# Patient Record
Sex: Male | Born: 1975 | Race: White | Hispanic: No | Marital: Married | Smoking: Former smoker
Health system: Southern US, Community
[De-identification: ages and names within clinical notes are randomized; demographics above are authoritative.]

## PROBLEM LIST (undated history)

## (undated) DIAGNOSIS — D649 Anemia, unspecified: Secondary | ICD-10-CM

## (undated) DIAGNOSIS — E7800 Pure hypercholesterolemia, unspecified: Secondary | ICD-10-CM

## (undated) DIAGNOSIS — I25119 Atherosclerotic heart disease of native coronary artery with unspecified angina pectoris: Secondary | ICD-10-CM

## (undated) DIAGNOSIS — M4802 Spinal stenosis, cervical region: Secondary | ICD-10-CM

## (undated) DIAGNOSIS — E162 Hypoglycemia, unspecified: Secondary | ICD-10-CM

## (undated) DIAGNOSIS — I10 Essential (primary) hypertension: Secondary | ICD-10-CM

## (undated) DIAGNOSIS — Z955 Presence of coronary angioplasty implant and graft: Secondary | ICD-10-CM

## (undated) HISTORY — PX: BACK SURGERY: SHX140

## (undated) HISTORY — PX: VASECTOMY: SHX75

## (undated) HISTORY — PX: CORONARY ANGIOPLASTY WITH STENT PLACEMENT: SHX49

## (undated) HISTORY — PX: VASECTOMY REVERSAL: SHX243

## (undated) HISTORY — PX: CARDIAC CATHETERIZATION: SHX172

## (undated) HISTORY — PX: ANTERIOR CERVICAL DECOMP/DISCECTOMY FUSION: SHX1161

---

## 2008-03-04 ENCOUNTER — Encounter: Admission: RE | Admit: 2008-03-04 | Discharge: 2008-03-04 | Payer: Self-pay | Admitting: Family Medicine

## 2014-12-24 ENCOUNTER — Emergency Department (HOSPITAL_BASED_OUTPATIENT_CLINIC_OR_DEPARTMENT_OTHER)
Admission: EM | Admit: 2014-12-24 | Discharge: 2014-12-24 | Disposition: A | Discharge status: Home / Self Care | Payer: BLUE CROSS/BLUE SHIELD | Attending: Emergency Medicine | Admitting: Emergency Medicine

## 2014-12-24 ENCOUNTER — Encounter (HOSPITAL_BASED_OUTPATIENT_CLINIC_OR_DEPARTMENT_OTHER): Payer: Self-pay | Admitting: Emergency Medicine

## 2014-12-24 DIAGNOSIS — R197 Diarrhea, unspecified: Secondary | ICD-10-CM | POA: Diagnosis not present

## 2014-12-24 DIAGNOSIS — R509 Fever, unspecified: Secondary | ICD-10-CM | POA: Diagnosis not present

## 2014-12-24 DIAGNOSIS — I10 Essential (primary) hypertension: Secondary | ICD-10-CM | POA: Diagnosis not present

## 2014-12-24 DIAGNOSIS — E860 Dehydration: Secondary | ICD-10-CM | POA: Insufficient documentation

## 2014-12-24 DIAGNOSIS — R630 Anorexia: Secondary | ICD-10-CM | POA: Insufficient documentation

## 2014-12-24 DIAGNOSIS — K6289 Other specified diseases of anus and rectum: Secondary | ICD-10-CM | POA: Diagnosis not present

## 2014-12-24 DIAGNOSIS — Z79899 Other long term (current) drug therapy: Secondary | ICD-10-CM | POA: Diagnosis not present

## 2014-12-24 DIAGNOSIS — R112 Nausea with vomiting, unspecified: Secondary | ICD-10-CM

## 2014-12-24 HISTORY — DX: Essential (primary) hypertension: I10

## 2014-12-24 MED ORDER — ONDANSETRON HCL 4 MG/2ML IJ SOLN
4.0000 mg | Freq: Once | INTRAMUSCULAR | Status: AC
  Administered 2014-12-24: 4 mg via INTRAVENOUS
  Filled 2014-12-24: qty 2

## 2014-12-24 MED ORDER — DEXTROSE 5 % IV BOLUS
1000.0000 mL | Freq: Once | INTRAVENOUS | Status: AC
  Administered 2014-12-24: 1000 mL via INTRAVENOUS

## 2014-12-24 MED ORDER — GI COCKTAIL ~~LOC~~
30.0000 mL | Freq: Once | ORAL | Status: AC
  Administered 2014-12-24: 30 mL via ORAL
  Filled 2014-12-24: qty 30

## 2014-12-24 MED ORDER — SODIUM CHLORIDE 0.9 % IV BOLUS (SEPSIS)
1000.0000 mL | Freq: Once | INTRAVENOUS | Status: AC
  Administered 2014-12-24: 1000 mL via INTRAVENOUS

## 2014-12-24 NOTE — ED Notes (Signed)
Pt reports vomiting and diarrhea for last 2 days, unable to keep liquids or food down, last time vomited this am, last episode of diarrhea 2 this evening,

## 2014-12-24 NOTE — ED Provider Notes (Addendum)
CSN: [id number]     Arrival date & time 12/24/14  2056 History  This chart was scribed for Gwyneth SproutWhitney Azzam Mehra, MD by Freida Busmaniana Omoyeni, ED Scribe. This patient was seen in room MH12/MH12 and the patient's care was started 9:14 PM.    Chief Complaint  Patient presents with  . Emesis   The history is provided by the patient. No language interpreter was used.     HPI Comments:  Harry Cervantes is a 39 y.o. male who presents to the Emergency Department complaining of nausea, vomiting and diarrhea for 2 days. He last vomited this am. He reports associated cramping abdominal pain, decreased PO intake secondary to nausea, subjective fever and chills . He denies blood in his vomit or stool,. He also denies h/o stomach ulcers, recent abx use or travel outside of the country and  sick contacts. No alleviating factors noted.   Past Medical History  Diagnosis Date  . Hypertension    History reviewed. No pertinent past surgical history. History reviewed. No pertinent family history. History  Substance Use Topics  . Smoking status: Former Games developermoker  . Smokeless tobacco: Current User  . Alcohol Use: No    Review of Systems  Constitutional: Positive for fever, chills and appetite change.  Gastrointestinal: Positive for vomiting, diarrhea and rectal pain. Negative for blood in stool.  All other systems reviewed and are negative.     Allergies  Review of patient's allergies indicates no known allergies.  Home Medications   Prior to Admission medications   Medication Sig Start Date End Date Taking? Authorizing Provider  amLODipine (NORVASC) 10 MG tablet Take 10 mg by mouth daily.   Yes Historical Provider, MD  lisinopril (PRINIVIL,ZESTRIL) 10 MG tablet Take 10 mg by mouth daily.   Yes Historical Provider, MD   BP 154/99 mmHg  Pulse 91  Temp(Src) 98.6 F (37 C) (Oral)  Resp 18  Ht 6' (1.829 m)  Wt 220 lb (99.791 kg)  BMI 29.83 kg/m2  SpO2 100% Physical Exam  Constitutional: He is  oriented to person, place, and time. He appears well-developed and well-nourished. No distress.  HENT:  Head: Normocephalic and atraumatic.  Dry Mucous Membranes  Eyes: Conjunctivae and EOM are normal. Pupils are equal, round, and reactive to light.  Cardiovascular: Normal rate and intact distal pulses.   No murmur heard. Pulmonary/Chest: Effort normal and breath sounds normal. No respiratory distress. He has no wheezes. He has no rales.  Abdominal: Soft. He exhibits no distension. There is no rebound and no guarding.  Mild diffuse tenderness  Neurological: He is alert and oriented to person, place, and time.  Skin: Skin is warm and dry.  Psychiatric: He has a normal mood and affect. His behavior is normal.  Nursing note and vitals reviewed.   ED Course  Procedures   DIAGNOSTIC STUDIES:  Oxygen Saturation is 100% on RA, normal by my interpretation.    COORDINATION OF CARE:  9:18 PM Will order IV fluids and nausea meds. Discussed treatment plan with pt at bedside and pt agreed to plan.  Labs Review Labs Reviewed - No data to display  Imaging Review No results found.   EKG Interpretation None      MDM   Final diagnoses:  Nausea vomiting and diarrhea  Dehydration    Pt with symptoms most consistent with a viral process with fever/vomitting/diarrhea.  Denies bad food exposure and recent travel out of the country.  No recent abx.  No hx concerning for GU pathology  or kidney stones.  Pt is awake and alert on exam without peritoneal signs.  After IV fluids and gi cocktail and zofran pt feeling much better and d/ced home.  Tolerating po's.  I personally performed the services described in this documentation, which was scribed in my presence.  The recorded information has been reviewed and considered.    Gwyneth Sprout, MD 12/24/14 8119  Gwyneth Sprout, MD 12/24/14 2322

## 2016-10-02 ENCOUNTER — Other Ambulatory Visit: Payer: Self-pay

## 2016-10-02 ENCOUNTER — Encounter (HOSPITAL_COMMUNITY): Payer: Self-pay

## 2016-10-02 ENCOUNTER — Emergency Department (HOSPITAL_COMMUNITY)
Admission: EM | Admit: 2016-10-02 | Discharge: 2016-10-02 | Disposition: A | Discharge status: Home / Self Care | Payer: Managed Care, Other (non HMO) | Attending: Emergency Medicine | Admitting: Emergency Medicine

## 2016-10-02 ENCOUNTER — Emergency Department (HOSPITAL_COMMUNITY): Payer: Managed Care, Other (non HMO)

## 2016-10-02 DIAGNOSIS — Z87891 Personal history of nicotine dependence: Secondary | ICD-10-CM | POA: Diagnosis not present

## 2016-10-02 DIAGNOSIS — I499 Cardiac arrhythmia, unspecified: Secondary | ICD-10-CM | POA: Diagnosis not present

## 2016-10-02 DIAGNOSIS — E119 Type 2 diabetes mellitus without complications: Secondary | ICD-10-CM | POA: Insufficient documentation

## 2016-10-02 DIAGNOSIS — Z79899 Other long term (current) drug therapy: Secondary | ICD-10-CM | POA: Insufficient documentation

## 2016-10-02 DIAGNOSIS — I10 Essential (primary) hypertension: Secondary | ICD-10-CM | POA: Diagnosis not present

## 2016-10-02 DIAGNOSIS — I498 Other specified cardiac arrhythmias: Secondary | ICD-10-CM

## 2016-10-02 DIAGNOSIS — R002 Palpitations: Secondary | ICD-10-CM

## 2016-10-02 LAB — CBC
HCT: 40.9 % (ref 39.0–52.0)
Hemoglobin: 13.8 g/dL (ref 13.0–17.0)
MCH: 29.5 pg (ref 26.0–34.0)
MCHC: 33.7 g/dL (ref 30.0–36.0)
MCV: 87.4 fL (ref 78.0–100.0)
PLATELETS: 235 10*3/uL (ref 150–400)
RBC: 4.68 MIL/uL (ref 4.22–5.81)
RDW: 13.1 % (ref 11.5–15.5)
WBC: 10.7 10*3/uL — AB (ref 4.0–10.5)

## 2016-10-02 LAB — BASIC METABOLIC PANEL
Anion gap: 11 (ref 5–15)
BUN: 21 mg/dL — AB (ref 6–20)
CO2: 23 mmol/L (ref 22–32)
CREATININE: 1.02 mg/dL (ref 0.61–1.24)
Calcium: 9.1 mg/dL (ref 8.9–10.3)
Chloride: 104 mmol/L (ref 101–111)
GFR calc Af Amer: >60 mL/min (ref >60)
GLUCOSE: 119 mg/dL — AB (ref 65–99)
Potassium: 3.3 mmol/L — ABNORMAL LOW (ref 3.5–5.1)
SODIUM: 138 mmol/L (ref 135–145)

## 2016-10-02 LAB — I-STAT TROPONIN, ED: Troponin i, poc: 0 ng/mL (ref 0.00–0.08)

## 2016-10-02 NOTE — ED Notes (Signed)
Pt understood dc material. NAD noted. 

## 2016-10-02 NOTE — ED Notes (Signed)
Patient transported to X-ray 

## 2016-10-02 NOTE — Discharge Instructions (Signed)
Your EKG shows sinus arrhythmia.  This is a nonspecific finding.  It is advised that you follow-up with a cardiologist.  Please see the cardiologist before returning to any dangerous activity.

## 2016-10-02 NOTE — ED Provider Notes (Signed)
[facility]-EMERGENCY DEPT Provider Note   CSN: [id number] Arrival date & time: 10/02/16  1933     History   Chief Complaint Chief Complaint  Patient presents with  . Chest Pain    HPI Harry Cervantes is a 41 y.o. male.  Patient with past medical history of diabetes and hypertension presents emergency department with chief complaint of heart palpitations. He also reports that he had some chest pain yesterday. He denies any chest pain now. He states that when he has the palpitations, he feels short of breath. He denies any radiating pain, diaphoresis, nausea, or vomiting. He has not taken anything for his symptoms. He denies any history of PE, DVT, leg swelling, recent long travel or surgeries.   The history is provided by the patient. No language interpreter was used.    Past Medical History:  Diagnosis Date  . Diabetes mellitus without complication (HCC)   . Hypertension     There are no active problems to display for this patient.   History reviewed. No pertinent surgical history.     Home Medications    Prior to Admission medications   Medication Sig Start Date End Date Taking? Authorizing Provider  amLODipine (NORVASC) 10 MG tablet Take 10 mg by mouth daily.    Historical Provider, MD  lisinopril (PRINIVIL,ZESTRIL) 10 MG tablet Take 10 mg by mouth daily.    Historical Provider, MD    Family History No family history on file.  Social History Social History  Substance Use Topics  . Smoking status: Former Games developer  . Smokeless tobacco: Current User  . Alcohol use No     Allergies   Patient has no known allergies.   Review of Systems Review of Systems  Respiratory: Positive for shortness of breath.   Cardiovascular: Positive for palpitations.  All other systems reviewed and are negative.    Physical Exam Updated Vital Signs BP 128/76   Pulse 65   Temp 98.7 F (37.1 C) (Oral)   Resp 12   Ht 6' (1.829 m)   Wt 102.1 kg   SpO2 98%   BMI 30.52 kg/m     Physical Exam  Constitutional: He is oriented to person, place, and time. He appears well-developed and well-nourished.  HENT:  Head: Normocephalic and atraumatic.  Eyes: Conjunctivae and EOM are normal. Pupils are equal, round, and reactive to light. Right eye exhibits no discharge. Left eye exhibits no discharge. No scleral icterus.  Neck: Normal range of motion. Neck supple. No JVD present.  Cardiovascular: Normal rate, regular rhythm and normal heart sounds.  Exam reveals no gallop and no friction rub.   No murmur heard. Pulmonary/Chest: Effort normal and breath sounds normal. No respiratory distress. He has no wheezes. He has no rales. He exhibits no tenderness.  Abdominal: Soft. He exhibits no distension and no mass. There is no tenderness. There is no rebound and no guarding.  Musculoskeletal: Normal range of motion. He exhibits no edema or tenderness.  Neurological: He is alert and oriented to person, place, and time.  Skin: Skin is warm and dry.  Psychiatric: He has a normal mood and affect. His behavior is normal. Judgment and thought content normal.  Nursing note and vitals reviewed.    ED Treatments / Results  Labs (all labs ordered are listed, but only abnormal results are displayed) Labs Reviewed  BASIC METABOLIC PANEL - Abnormal; Notable for the following:       Result Value   Potassium 3.3 (*)  Glucose, Bld 119 (*)    BUN 21 (*)    All other components within normal limits  CBC - Abnormal; Notable for the following:    WBC 10.7 (*)    All other components within normal limits  I-STAT TROPOININ, ED    EKG  EKG Interpretation  Date/Time:  Sunday October 02 2016 19:45:30 EST Ventricular Rate:  87 PR Interval:  168 QRS Duration: 96 QT Interval:  350 QTC Calculation: 421 R Axis:   47 Text Interpretation:  Sinus rhythm with marked sinus arrhythmia Septal infarct , age undetermined Abnormal ECG no prior available for comparison Confirmed by Lincoln Brighamees, Liz  [phone]([id number]) on 10/02/2016 9:14:18 PM       Radiology Dg Chest 2 View  Result Date: 10/02/2016 CLINICAL DATA:  Central chest pain EXAM: CHEST  2 VIEW COMPARISON:  06/30/2015 FINDINGS: Slight elevation of the left diaphragm. No acute consolidation or effusion. Normal heart size. No pneumothorax. IMPRESSION: No active cardiopulmonary disease. Electronically Signed   By: Jasmine PangKim  Fujinaga M.D.   On: 10/02/2016 21:10    Procedures Procedures (including critical care time)  Medications Ordered in ED Medications - No data to display   Initial Impression / Assessment and Plan / ED Course  I have reviewed the triage vital signs and the nursing notes.  Pertinent labs & imaging results that were available during my care of the patient were reviewed by me and considered in my medical decision making (see chart for details).    Patient with palpitations. Had some chest pain yesterday, but none now. He states that he can feel his heartbeat in his breathing. He denies any stimulant use. Denies any history of the same. States that his father has atrial fibrillation. I reassured the patient that his EKG is not consistent with A. fib.  EKG shows sinus arrhythmia. Troponin is negative. Patient does not have chest pain now. Discussed case with Dr. Madilyn Hookees, who agrees with plan for cardiology follow-up.  Final Clinical Impressions(s) / ED Diagnoses   Final diagnoses:  Palpitations  Sinus arrhythmia    New Prescriptions New Prescriptions   No medications on file     Roxy HorsemanRobert Shiquita Collignon, PA-C 10/02/16 2313    Tilden FossaElizabeth Rees, MD 10/04/16 1043

## 2016-10-02 NOTE — ED Triage Notes (Signed)
Pt states that central CP started yesterday, feeling like his heart is beating strange. C/o of SOB, denies radiation, n/v.

## 2016-10-05 ENCOUNTER — Ambulatory Visit (INDEPENDENT_AMBULATORY_CARE_PROVIDER_SITE_OTHER): Payer: Managed Care, Other (non HMO) | Admitting: Cardiology

## 2016-10-05 ENCOUNTER — Telehealth (HOSPITAL_COMMUNITY): Payer: Self-pay

## 2016-10-05 ENCOUNTER — Encounter: Payer: Self-pay | Admitting: Cardiology

## 2016-10-05 VITALS — BP 130/84 | HR 81 | Ht 72.0 in | Wt 228.2 lb

## 2016-10-05 DIAGNOSIS — R002 Palpitations: Secondary | ICD-10-CM | POA: Insufficient documentation

## 2016-10-05 DIAGNOSIS — R079 Chest pain, unspecified: Secondary | ICD-10-CM

## 2016-10-05 DIAGNOSIS — I209 Angina pectoris, unspecified: Secondary | ICD-10-CM | POA: Insufficient documentation

## 2016-10-05 LAB — COMPLETE METABOLIC PANEL WITH GFR
ALBUMIN: 4 g/dL (ref 3.6–5.1)
ALK PHOS: 57 U/L (ref 40–115)
ALT: 16 U/L (ref 9–46)
AST: 17 U/L (ref 10–40)
BUN: 17 mg/dL (ref 7–25)
CALCIUM: 9.1 mg/dL (ref 8.6–10.3)
CHLORIDE: 101 mmol/L (ref 98–110)
CO2: 31 mmol/L (ref 20–31)
Creat: 1.13 mg/dL (ref 0.60–1.35)
GFR, EST NON AFRICAN AMERICAN: 80 mL/min (ref >=60)
GFR, Est African American: >89 mL/min (ref >=60)
Glucose, Bld: 84 mg/dL (ref 65–99)
POTASSIUM: 4.5 mmol/L (ref 3.5–5.3)
SODIUM: 140 mmol/L (ref 135–146)
Total Bilirubin: 0.6 mg/dL (ref 0.2–1.2)
Total Protein: 6.9 g/dL (ref 6.1–8.1)

## 2016-10-05 LAB — CBC
HEMATOCRIT: 42.5 % (ref 38.5–50.0)
Hemoglobin: 13.9 g/dL (ref 13.2–17.1)
MCH: 29.3 pg (ref 27.0–33.0)
MCHC: 32.7 g/dL (ref 32.0–36.0)
MCV: 89.7 fL (ref 80.0–100.0)
MPV: 9.7 fL (ref 7.5–12.5)
Platelets: 242 10*3/uL (ref 140–400)
RBC: 4.74 MIL/uL (ref 4.20–5.80)
RDW: 13.5 % (ref 11.0–15.0)
WBC: 6.3 10*3/uL (ref 3.8–10.8)

## 2016-10-05 LAB — T3, FREE: T3 FREE: 3 pg/mL (ref 2.3–4.2)

## 2016-10-05 LAB — TSH: TSH: 8.91 mIU/L — ABNORMAL HIGH (ref 0.40–4.50)

## 2016-10-05 LAB — T4, FREE: FREE T4: 1 ng/dL (ref 0.8–1.8)

## 2016-10-05 NOTE — Telephone Encounter (Signed)
Encounter complete. 

## 2016-10-05 NOTE — Patient Instructions (Signed)
Medication Instructions:  Your physician recommends that you continue on your current medications as directed. Please refer to the Current Medication list given to you today.  Labwork: Your physician recommends that you return for lab work in: TODAY-VITAMIN D, CBC, CMP, MAGNESIUM, TSH, FREE T3 AND T4  Testing/Procedures: Your physician has recommended that you wear a 48 HOUR holter monitor. Holter monitors are medical devices that record the heart's electrical activity. Doctors most often use these monitors to diagnose arrhythmias. Arrhythmias are problems with the speed or rhythm of the heartbeat. The monitor is a small, portable device. You can wear one while you do your normal daily activities. This is usually used to diagnose what is causing palpitations/syncope (passing out).  Your physician has requested that you have en exercise stress myoview. For further information please visit [email or web address]. Please follow instruction sheet, as given.  Both studies will be done at [facility] [address] [facility].  Follow-Up: Your physician recommends that you schedule a follow-up appointment in: 1 WEEK WITH DR BERRY OR LUKE, OR RHONDA   Any Other Special Instructions Will Be Listed Below (If Applicable).  If you need a refill on your cardiac medications before your next appointment, please call your pharmacy.

## 2016-10-05 NOTE — Progress Notes (Signed)
10/05/2016 Harry Cervantes   1976/04/01  [id number]  Primary Physician Marthenia Rolling, MD Primary Cardiologist: New (Dr. Allyson Sabal, DOD)   Reason for Visit/CC: New Patient Evaluation for Palpitations  Referring Provider: Roxy Horseman, PA-C, [facility] ED  HPI:  Harry Cervantes is a 41 year old male who presents to clinic as a new patient after being referred by the [facility] emergency department for evaluation given recent symptoms of palpitations. He has no prior cardiac history however he has cardiac risk factors which include hypertension and type 2 diabetes. He is also a former smoker of 10+ years but recently quit 8 months ago.   He presented to the [facility] emergency department on 10/02/2016 with a chief complaint of palpitations. Symptoms were also associated with dyspnea and chest discomfort. He had mild diarrhea the day prior. No stimulant use prior to development of symptoms, including no caffeine or ETOH.   In the ED basic laboratory work revealed mild hypokalemia with a K level of 3.3. Creatinine was normal at 1.02. BUN was slightly elevated at 21. CBC also showed a very slight leukocytosis with a white blood cell count of 10.7. However he was noted to be afebrile. Hemoglobin was normal at 13.8. POC troponin was negative. CXR was unremarkable with no active cardiopulmonary disease. EKG showed sinus rhythm with marked sinus arrhythmia. He was evaluated in the ED by Roxy Horseman, PA-C as well as Dr. Olivia Canter. Based on his fairly unremarkable workup, he was released from the ED and was referred to our office for further assessment.  His EKG today shows NSR. HR is 71 bpm. BP is controlled at 130/84. No palpitations currently, however he has continued to have intermittent palpitations since being seen in the ED. He also has left sided chest discomfort that occurs mainly with moderate physical activity. Sometimes sharp but also achey pain. No significant symptoms at rest. His wife is  concerned about his chronic fatigue. He has very little energy.    Current Meds  Medication Sig  . amLODipine (NORVASC) 10 MG tablet Take 10 mg by mouth daily.  Marland Kitchen lisinopril (PRINIVIL,ZESTRIL) 10 MG tablet Take 10 mg by mouth daily.   No Known Allergies Past Medical History:  Diagnosis Date  . Diabetes mellitus without complication (HCC)   . Hypertension    Family History  Problem Relation Age of Onset  . Hypertension Mother   . Hypertension Father   . COPD Father   . Heart disease Father   . Congestive Heart Failure Father    History reviewed. No pertinent surgical history. Social History   Social History  . Marital status: Single    Spouse name: N/A  . Number of children: N/A  . Years of education: N/A   Occupational History  . Not on file.   Social History Main Topics  . Smoking status: Former Games developer  . Smokeless tobacco: Current User  . Alcohol use No  . Drug use: Unknown  . Sexual activity: Not on file   Other Topics Concern  . Not on file   Social History Narrative  . No narrative on file     Review of Systems: General: negative for chills, fever, night sweats or weight changes, + for fatigue.  Cardiovascular: + for chest pain, - dyspnea on exertion, edema, orthopnea, + palpitations, - paroxysmal nocturnal dyspnea or shortness of breath Dermatological: negative for rash Respiratory: negative for cough or wheezing Urologic: negative for hematuria Abdominal: negative for nausea, vomiting, diarrhea, bright red blood per  rectum, melena, or hematemesis Neurologic: negative for visual changes, syncope, or dizziness All other systems reviewed and are otherwise negative except as noted above.   Physical Exam:  Blood pressure 130/84, pulse 81, height 6' (1.829 m), weight 228 lb 3.2 oz (103.5 kg).  General appearance: alert, cooperative and no distress Neck: no carotid bruit and no JVD Lungs: clear to auscultation bilaterally Heart: regular rate and rhythm,  S1, S2 normal, no murmur, click, rub or gallop Extremities: extremities normal, atraumatic, no cyanosis or edema Pulses: 2+ and symmetric Skin: Skin color, texture, turgor normal. No rashes or lesions Neurologic: Grossly normal  EKG NSR. No ischemia.   ASSESSMENT AND PLAN:   1. Palpitations: pt continues to have intermittent palpitations daily. He is currently asymptomatic with EKG showing NSR. We will arrange for a 48 hr monitor. We will also recheck labs to see if abnormalities found in the ED have resolved. We will recheck a CBC and CMP. We will also check a Mg level as well as thyroid function test. Given his fatigue, we will also check a Vit-D level.   2. Chest Pain: mixed typical + atypical features. He has risk factors including HTN, 10+ year h/o tobacco abuse (quit 8 months ago), + ? H/o DM. We will arrange for an exercise Myoview to r/o coronary ischemia.   3. HTN: BP is controlled.   PLAN  F/u in 1-2 weeks with APP after w/u is complete   The above patient case with discussed with Dr. Allyson SabalBerry, DOD. He was involved in the decision making and agrees to the above plan.   Janyiah Silveri PA-C 10/05/2016 9:08 AM

## 2016-10-06 ENCOUNTER — Ambulatory Visit (HOSPITAL_COMMUNITY)
Admission: RE | Admit: 2016-10-06 | Discharge: 2016-10-06 | Disposition: A | Discharge status: Home / Self Care | Payer: Managed Care, Other (non HMO) | Source: Ambulatory Visit | Attending: Cardiology | Admitting: Cardiology

## 2016-10-06 ENCOUNTER — Telehealth: Payer: Self-pay | Admitting: Cardiology

## 2016-10-06 ENCOUNTER — Ambulatory Visit (INDEPENDENT_AMBULATORY_CARE_PROVIDER_SITE_OTHER): Payer: Managed Care, Other (non HMO)

## 2016-10-06 DIAGNOSIS — I510 Cardiac septal defect, acquired: Secondary | ICD-10-CM | POA: Diagnosis not present

## 2016-10-06 DIAGNOSIS — R002 Palpitations: Secondary | ICD-10-CM

## 2016-10-06 DIAGNOSIS — I5189 Other ill-defined heart diseases: Secondary | ICD-10-CM | POA: Diagnosis not present

## 2016-10-06 LAB — MYOCARDIAL PERFUSION IMAGING
CHL CUP MPHR: 179 {beats}/min
CHL CUP NUCLEAR SRS: 8
CHL CUP NUCLEAR SSS: 12
CSEPED: 13 min
CSEPEW: 14.2 METS
CSEPPHR: 176 {beats}/min
Exercise duration (sec): 0 s
LV dias vol: 148 mL (ref 62–150)
LVSYSVOL: 85 mL
NUC STRESS TID: 1.14
Percent HR: 98 %
RPE: 17
Rest HR: 66 {beats}/min
SDS: 4

## 2016-10-06 LAB — MAGNESIUM: MAGNESIUM: 1.9 mg/dL (ref 1.5–2.5)

## 2016-10-06 LAB — VITAMIN D 25 HYDROXY (VIT D DEFICIENCY, FRACTURES): VIT D 25 HYDROXY: 27 ng/mL — AB (ref 30–100)

## 2016-10-06 MED ORDER — TECHNETIUM TC 99M TETROFOSMIN IV KIT
10.4000 | PACK | Freq: Once | INTRAVENOUS | Status: AC | PRN
  Administered 2016-10-06: 10.4 via INTRAVENOUS
  Filled 2016-10-06: qty 11

## 2016-10-06 MED ORDER — TECHNETIUM TC 99M TETROFOSMIN IV KIT
30.8000 | PACK | Freq: Once | INTRAVENOUS | Status: AC | PRN
  Administered 2016-10-06: 30.8 via INTRAVENOUS
  Filled 2016-10-06: qty 31

## 2016-10-06 NOTE — Telephone Encounter (Signed)
Patient is having stress test and wife, tara, is here with him.  She is very anxious about results of test, etc.  He had labwork yesterday and she is asking about results.  Would like for someone to speak with her while she is here.

## 2016-10-06 NOTE — Telephone Encounter (Signed)
Pt calling again,he wants his lab results from yesterday and wants to know if his stress test results are ready from today?

## 2016-10-06 NOTE — Telephone Encounter (Signed)
Notified patient that results need to be reviewed by provider and they will be contacted with information when it is available, likely Monday at the latest.

## 2016-10-07 ENCOUNTER — Ambulatory Visit (INDEPENDENT_AMBULATORY_CARE_PROVIDER_SITE_OTHER): Payer: Managed Care, Other (non HMO) | Admitting: Cardiovascular Disease

## 2016-10-07 ENCOUNTER — Encounter: Payer: Self-pay | Admitting: Cardiovascular Disease

## 2016-10-07 ENCOUNTER — Other Ambulatory Visit: Payer: Self-pay | Admitting: Cardiovascular Disease

## 2016-10-07 ENCOUNTER — Telehealth: Payer: Self-pay | Admitting: *Deleted

## 2016-10-07 VITALS — BP 126/83 | HR 78 | Ht 72.0 in | Wt 232.0 lb

## 2016-10-07 DIAGNOSIS — R079 Chest pain, unspecified: Secondary | ICD-10-CM

## 2016-10-07 DIAGNOSIS — R9439 Abnormal result of other cardiovascular function study: Secondary | ICD-10-CM | POA: Diagnosis not present

## 2016-10-07 DIAGNOSIS — Z01812 Encounter for preprocedural laboratory examination: Secondary | ICD-10-CM

## 2016-10-07 LAB — CBC WITH DIFFERENTIAL/PLATELET
Basophils Absolute: 0 cells/uL (ref 0–200)
Basophils Relative: 0 %
EOS ABS: 272 {cells}/uL (ref 15–500)
Eosinophils Relative: 4 %
HEMATOCRIT: 40.4 % (ref 38.5–50.0)
Hemoglobin: 13.4 g/dL (ref 13.2–17.1)
LYMPHS PCT: 32 %
Lymphs Abs: 2176 cells/uL (ref 850–3900)
MCH: 29.6 pg (ref 27.0–33.0)
MCHC: 33.2 g/dL (ref 32.0–36.0)
MCV: 89.2 fL (ref 80.0–100.0)
MONO ABS: 612 {cells}/uL (ref 200–950)
MONOS PCT: 9 %
MPV: 9.8 fL (ref 7.5–12.5)
NEUTROS PCT: 55 %
Neutro Abs: 3740 cells/uL (ref 1500–7800)
Platelets: 234 10*3/uL (ref 140–400)
RBC: 4.53 MIL/uL (ref 4.20–5.80)
RDW: 13.5 % (ref 11.0–15.0)
WBC: 6.8 10*3/uL (ref 3.8–10.8)

## 2016-10-07 NOTE — Telephone Encounter (Signed)
Spoke to Dr. Renea EeBerry-add on to schedule to see him today.  Billie add on to scheduled for 10 AM  Called and spoke to wife (ok per DPR)-advised to have patient come in at 10AM today to see Dr. Allyson SabalBerry and discuss results.  Advised if this time does not work please call and let us know, we can add him on this evening.  Wife verbalized understanding and was going to call and tell patient.

## 2016-10-07 NOTE — Addendum Note (Signed)
Addended by: Evans LanceSTOVER, Dorine Duffey W on: 10/07/2016 10:49 AM   Modules accepted: Orders

## 2016-10-07 NOTE — Patient Instructions (Signed)
Your physician has requested that you have a cardiac catheterization on Monday, 10/10/16 with Dr. Allyson SabalBerry (Dx: Chest pain). Cardiac catheterization is used to diagnose and/or treat various heart conditions. Doctors may recommend this procedure for a number of different reasons. The most common reason is to evaluate chest pain. Chest pain can be a symptom of coronary artery disease (CAD), and cardiac catheterization can show whether plaque is narrowing or blocking your heart's arteries. This procedure is also used to evaluate the valves, as well as measure the blood flow and oxygen levels in different parts of your heart. For further information please visit [email or web address].   Following your catheterization, you will not be allowed to drive for 3 days.  No lifting, pushing, or pulling greater that 10 pounds is allowed for 1 week.  You will be required to have the following tests prior to the procedure:  1. Blood work-the blood work can be done no more than 14 days prior to the procedure.  It can be done at any [facility] lab.  There is one downstairs on the first floor of this building and one in the [facility] building [phone]([address], [address]).  Puncture site Rt. Radial

## 2016-10-07 NOTE — Telephone Encounter (Signed)
Received staff message:   High Risk Nuclear study read by Dr Royann Shiversroitoru on 02.08.18. This test was ordered by Robbie LisBrittainy Simmons, PA.  Primary Cardiologist: Dr. Allyson SabalBerry. OV: 10/12/16 with Theodore Demarkhonda Barrett PA   Routed to Dr. Allyson SabalBerry (in office today), Boyce MediciBrittany Simmons PA and primary to review.

## 2016-10-07 NOTE — Assessment & Plan Note (Signed)
[name] returns for follow-up of his Myoview stress test that was performed yesterday. 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 I'm going to begin him on a baby aspirin and perform outpatient right radial cortical catheterization on him this coming Monday.The patient understands that risks included but are not limited to stroke (1 in 1000), death (1 in 1000), kidney failure [usually temporary] (1 in 500), bleeding (1 in 200), allergic reaction [possibly serious] (1 in 200). The patient understands and agrees to proceed

## 2016-10-07 NOTE — Progress Notes (Signed)
     10/07/2016 Harry Cervantes   08/04/1976  [id number]  Primary Physician Marthenia RollingHOWELL,TRAVIS, MD Primary Cardiologist: Runell GessJonathan J Makyia Erxleben MD Roseanne RenoFACP, FACC, FAHA, FSCAI  HPI:  [name] is a delightful 41 year old demented appearing married Caucasian male father of 5 children which accompany by his wife Harry Cervantes today. He [facility] on 10/05/16. He was seen in the emergency room several days before for palpitations and chest pain. This was factors for heart disease include treated hypertension, discontinued tobacco abuse and family history. Father did have a myocardial infarction and stent. 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 His Myoview stress test performed yesterday revealed positive GXT with ischemia in the LAD territory and an ejection fraction of 43%.   Current Outpatient Prescriptions  Medication Sig Dispense Refill  . amLODipine (NORVASC) 10 MG tablet Take 10 mg by mouth daily.    Marland Kitchen. lisinopril (PRINIVIL,ZESTRIL) 10 MG tablet Take 10 mg by mouth daily.     No current facility-administered medications for this visit.     No Known Allergies  Social History   Social History  . Marital status: Single    Spouse name: N/A  . Number of children: N/A  . Years of education: N/A   Occupational History  . Not on file.   Social History Main Topics  . Smoking status: Former Smoker    Quit date: 02/03/2016  . Smokeless tobacco: Current User    Types: Snuff  . Alcohol use No  . Drug use: Unknown  . Sexual activity: Not on file   Other Topics Concern  . Not on file   Social History Narrative  . No narrative on file     Review of Systems: General: negative for chills, fever, night sweats or weight changes.  Cardiovascular: negative for chest pain, dyspnea on exertion, edema, orthopnea, palpitations, paroxysmal nocturnal dyspnea or shortness of breath Dermatological: negative for rash Respiratory: negative for cough or wheezing Urologic: negative  for hematuria Abdominal: negative for nausea, vomiting, diarrhea, bright red blood per rectum, melena, or hematemesis Neurologic: negative for visual changes, syncope, or dizziness All other systems reviewed and are otherwise negative except as noted above.    Blood pressure 126/83, pulse 78, height 6' (1.829 m), weight 232 lb (105.2 kg), SpO2 100 %.  General appearance: alert and no distress Neck: no adenopathy, no carotid bruit, no JVD, supple, symmetrical, trachea midline and thyroid not enlarged, symmetric, no tenderness/mass/nodules Lungs: clear to auscultation bilaterally Heart: regular rate and rhythm, S1, S2 normal, no murmur, click, rub or gallop Extremities: extremities normal, atraumatic, no cyanosis or edema  EKG not performed today  ASSESSMENT AND PLAN:   Chest pain with moderate risk for cardiac etiology [name] returns for follow-up of his Myoview stress test that was performed yesterday. 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 I'm going to begin him on a baby aspirin and perform outpatient right radial cortical catheterization on him this coming Monday.The patient understands that risks included but are not limited to stroke (1 in 1000), death (1 in 1000), kidney failure [usually temporary] (1 in 500), bleeding (1 in 200), allergic reaction [possibly serious] (1 in 200). The patient understands and agrees to proceed      Runell GessJonathan J. Parry Po MD [facility] HospitalFACP,FACC,FAHA, [facility]SC[facility] 10/07/2016 10:42 AM

## 2016-10-10 ENCOUNTER — Inpatient Hospital Stay (HOSPITAL_COMMUNITY): Payer: Managed Care, Other (non HMO)

## 2016-10-10 ENCOUNTER — Encounter (HOSPITAL_COMMUNITY)
Admission: AD | Disposition: A | Discharge status: Home / Self Care | Payer: Self-pay | Source: Ambulatory Visit | Attending: Cardiovascular Disease

## 2016-10-10 ENCOUNTER — Encounter (HOSPITAL_COMMUNITY): Payer: Self-pay | Admitting: Cardiovascular Disease

## 2016-10-10 ENCOUNTER — Inpatient Hospital Stay (HOSPITAL_COMMUNITY)
Admission: AD | Admit: 2016-10-10 | Discharge: 2016-10-11 | DRG: 287 | Disposition: A | Discharge status: Home / Self Care | Payer: Managed Care, Other (non HMO) | Source: Ambulatory Visit | Attending: Cardiovascular Disease | Admitting: Cardiovascular Disease

## 2016-10-10 DIAGNOSIS — I25119 Atherosclerotic heart disease of native coronary artery with unspecified angina pectoris: Secondary | ICD-10-CM | POA: Diagnosis present

## 2016-10-10 DIAGNOSIS — I10 Essential (primary) hypertension: Secondary | ICD-10-CM | POA: Diagnosis present

## 2016-10-10 DIAGNOSIS — R9439 Abnormal result of other cardiovascular function study: Secondary | ICD-10-CM | POA: Diagnosis not present

## 2016-10-10 DIAGNOSIS — Z87891 Personal history of nicotine dependence: Secondary | ICD-10-CM

## 2016-10-10 DIAGNOSIS — F0390 Unspecified dementia without behavioral disturbance: Secondary | ICD-10-CM | POA: Diagnosis present

## 2016-10-10 DIAGNOSIS — Z8249 Family history of ischemic heart disease and other diseases of the circulatory system: Secondary | ICD-10-CM

## 2016-10-10 DIAGNOSIS — R079 Chest pain, unspecified: Secondary | ICD-10-CM

## 2016-10-10 DIAGNOSIS — I2510 Atherosclerotic heart disease of native coronary artery without angina pectoris: Principal | ICD-10-CM | POA: Diagnosis present

## 2016-10-10 DIAGNOSIS — R002 Palpitations: Secondary | ICD-10-CM | POA: Diagnosis not present

## 2016-10-10 DIAGNOSIS — I25118 Atherosclerotic heart disease of native coronary artery with other forms of angina pectoris: Secondary | ICD-10-CM | POA: Diagnosis not present

## 2016-10-10 DIAGNOSIS — I209 Angina pectoris, unspecified: Secondary | ICD-10-CM | POA: Diagnosis present

## 2016-10-10 HISTORY — PX: LEFT HEART CATH AND CORONARY ANGIOGRAPHY: CATH118249

## 2016-10-10 LAB — TROPONIN I: Troponin I: <0.03 ng/mL (ref <0.03)

## 2016-10-10 LAB — POCT ACTIVATED CLOTTING TIME
ACTIVATED CLOTTING TIME: 378 s
ACTIVATED CLOTTING TIME: 169 s

## 2016-10-10 LAB — PROTIME-INR
INR: 1.05
Prothrombin Time: 13.8 seconds (ref 11.4–15.2)

## 2016-10-10 LAB — MRSA PCR SCREENING: MRSA by PCR: NEGATIVE

## 2016-10-10 SURGERY — LEFT HEART CATH AND CORONARY ANGIOGRAPHY
Anesthesia: LOCAL

## 2016-10-10 MED ORDER — SODIUM CHLORIDE 0.9% FLUSH: 3.0000 mL | INTRAVENOUS | Status: DC | PRN

## 2016-10-10 MED ORDER — IOPAMIDOL (ISOVUE-370) INJECTION 76%
INTRAVENOUS | Status: AC
  Filled 2016-10-10: qty 50

## 2016-10-10 MED ORDER — AMLODIPINE BESYLATE 10 MG PO TABS: 10.0000 mg | ORAL_TABLET | Freq: Every day | ORAL | Status: DC

## 2016-10-10 MED ORDER — BIVALIRUDIN 250 MG IV SOLR
INTRAVENOUS | Status: AC
  Filled 2016-10-10: qty 250

## 2016-10-10 MED ORDER — VERAPAMIL HCL 2.5 MG/ML IV SOLN
INTRAVENOUS | Status: AC
  Filled 2016-10-10: qty 2

## 2016-10-10 MED ORDER — MORPHINE SULFATE (PF) 2 MG/ML IV SOLN: 2.0000 mg | INTRAVENOUS | Status: DC | PRN

## 2016-10-10 MED ORDER — ACETAMINOPHEN 325 MG PO TABS: 650.0000 mg | ORAL_TABLET | ORAL | Status: DC | PRN

## 2016-10-10 MED ORDER — BIVALIRUDIN BOLUS VIA INFUSION - CUPID
INTRAVENOUS | Status: DC | PRN
  Administered 2016-10-10: 76.575 mg via INTRAVENOUS

## 2016-10-10 MED ORDER — CLOPIDOGREL BISULFATE 300 MG PO TABS
ORAL_TABLET | ORAL | Status: AC
  Filled 2016-10-10: qty 2

## 2016-10-10 MED ORDER — SODIUM CHLORIDE 0.9 % WEIGHT BASED INFUSION
3.0000 mL/kg/h | INTRAVENOUS | Status: DC
  Administered 2016-10-10: 3 mL/kg/h via INTRAVENOUS

## 2016-10-10 MED ORDER — METOPROLOL TARTRATE 12.5 MG HALF TABLET
12.5000 mg | ORAL_TABLET | Freq: Two times a day (BID) | ORAL | Status: DC
  Administered 2016-10-10 – 2016-10-11 (×3): 12.5 mg via ORAL
  Filled 2016-10-10 (×3): qty 1

## 2016-10-10 MED ORDER — FENTANYL CITRATE (PF) 100 MCG/2ML IJ SOLN
INTRAMUSCULAR | Status: DC | PRN
  Administered 2016-10-10: 25 ug via INTRAVENOUS
  Administered 2016-10-10: 50 ug via INTRAVENOUS
  Administered 2016-10-10: 25 ug via INTRAVENOUS

## 2016-10-10 MED ORDER — LABETALOL HCL 5 MG/ML IV SOLN: 10.0000 mg | INTRAVENOUS | Status: AC | PRN

## 2016-10-10 MED ORDER — ASPIRIN 81 MG PO CHEW: 81.0000 mg | CHEWABLE_TABLET | ORAL | Status: DC

## 2016-10-10 MED ORDER — SODIUM CHLORIDE 0.9% FLUSH
3.0000 mL | Freq: Two times a day (BID) | INTRAVENOUS | Status: DC
  Administered 2016-10-10 – 2016-10-11 (×2): 3 mL via INTRAVENOUS

## 2016-10-10 MED ORDER — SODIUM CHLORIDE 0.9 % IV SOLN
INTRAVENOUS | Status: DC | PRN
  Administered 2016-10-10 (×2): 1.75 mg/kg/h via INTRAVENOUS

## 2016-10-10 MED ORDER — NITROGLYCERIN 1 MG/10 ML FOR IR/CATH LAB
INTRA_ARTERIAL | Status: AC
  Filled 2016-10-10: qty 10

## 2016-10-10 MED ORDER — FENTANYL CITRATE (PF) 100 MCG/2ML IJ SOLN
INTRAMUSCULAR | Status: AC
  Filled 2016-10-10: qty 2

## 2016-10-10 MED ORDER — CLOPIDOGREL BISULFATE 300 MG PO TABS
ORAL_TABLET | ORAL | Status: DC | PRN
  Administered 2016-10-10: 600 mg via ORAL

## 2016-10-10 MED ORDER — HYDRALAZINE HCL 20 MG/ML IJ SOLN: 5.0000 mg | INTRAMUSCULAR | Status: AC | PRN

## 2016-10-10 MED ORDER — ASPIRIN 81 MG PO CHEW: 81.0000 mg | CHEWABLE_TABLET | Freq: Every day | ORAL | Status: DC

## 2016-10-10 MED ORDER — FAMOTIDINE IN NACL 20-0.9 MG/50ML-% IV SOLN
INTRAVENOUS | Status: DC | PRN
  Administered 2016-10-10: 20 mg via INTRAVENOUS

## 2016-10-10 MED ORDER — FAMOTIDINE IN NACL 20-0.9 MG/50ML-% IV SOLN
INTRAVENOUS | Status: AC
  Filled 2016-10-10: qty 50

## 2016-10-10 MED ORDER — LIDOCAINE HCL (PF) 1 % IJ SOLN
INTRAMUSCULAR | Status: DC | PRN
  Administered 2016-10-10: 2 mL
  Administered 2016-10-10: 15 mL

## 2016-10-10 MED ORDER — SODIUM CHLORIDE 0.9 % IV SOLN
INTRAVENOUS | Status: AC
  Administered 2016-10-10: 13:00:00 via INTRAVENOUS

## 2016-10-10 MED ORDER — TRAZODONE HCL 50 MG PO TABS
50.0000 mg | ORAL_TABLET | Freq: Once | ORAL | Status: AC
  Administered 2016-10-10: 50 mg via ORAL
  Filled 2016-10-10: qty 1

## 2016-10-10 MED ORDER — HEPARIN (PORCINE) IN NACL 2-0.9 UNIT/ML-% IJ SOLN
INTRAMUSCULAR | Status: AC
  Filled 2016-10-10: qty 1000

## 2016-10-10 MED ORDER — MIDAZOLAM HCL 2 MG/2ML IJ SOLN
INTRAMUSCULAR | Status: DC | PRN
  Administered 2016-10-10 (×2): 1 mg via INTRAVENOUS

## 2016-10-10 MED ORDER — HEPARIN SODIUM (PORCINE) 1000 UNIT/ML IJ SOLN
INTRAMUSCULAR | Status: AC
  Filled 2016-10-10: qty 1

## 2016-10-10 MED ORDER — LIDOCAINE HCL (PF) 1 % IJ SOLN
INTRAMUSCULAR | Status: AC
  Filled 2016-10-10: qty 30

## 2016-10-10 MED ORDER — SODIUM CHLORIDE 0.9 % IV SOLN: 250.0000 mL | INTRAVENOUS | Status: DC | PRN

## 2016-10-10 MED ORDER — ONDANSETRON HCL 4 MG/2ML IJ SOLN: 4.0000 mg | Freq: Four times a day (QID) | INTRAMUSCULAR | Status: DC | PRN

## 2016-10-10 MED ORDER — IOPAMIDOL (ISOVUE-370) INJECTION 76%
INTRAVENOUS | Status: DC | PRN
  Administered 2016-10-10: 155 mL via INTRA_ARTERIAL

## 2016-10-10 MED ORDER — ASPIRIN EC 81 MG PO TBEC
81.0000 mg | DELAYED_RELEASE_TABLET | Freq: Every day | ORAL | Status: DC
  Administered 2016-10-11: 81 mg via ORAL
  Filled 2016-10-10: qty 1

## 2016-10-10 MED ORDER — LISINOPRIL 10 MG PO TABS
10.0000 mg | ORAL_TABLET | Freq: Every day | ORAL | Status: DC
  Administered 2016-10-11: 10 mg via ORAL
  Filled 2016-10-10: qty 1

## 2016-10-10 MED ORDER — HEPARIN (PORCINE) IN NACL 2-0.9 UNIT/ML-% IJ SOLN
INTRAMUSCULAR | Status: DC | PRN
  Administered 2016-10-10: 1000 mL

## 2016-10-10 MED ORDER — MIDAZOLAM HCL 2 MG/2ML IJ SOLN
INTRAMUSCULAR | Status: AC
  Filled 2016-10-10: qty 2

## 2016-10-10 MED ORDER — SODIUM CHLORIDE 0.9 % WEIGHT BASED INFUSION: 1.0000 mL/kg/h | INTRAVENOUS | Status: DC

## 2016-10-10 SURGICAL SUPPLY — 25 items
BALLN EUPHORA RX 2.0X12 (BALLOONS) ×2
BALLN MOZEC 2.0X12 (BALLOONS)
BALLN SPRINTER MX OTW 1.5X12 (BALLOONS) ×2
BALLOON EUPHORA RX 2.0X12 (BALLOONS) ×1 IMPLANT
BALLOON MOZEC 2.0X12 (BALLOONS) IMPLANT
BALLOON SPRINTER MX OTW 1.5X12 (BALLOONS) ×1 IMPLANT
CATH INFINITI 5FR MULTPACK ANG (CATHETERS) ×2 IMPLANT
CATH VISTA GUIDE 6FR XBLAD3.5 (CATHETERS) ×2 IMPLANT
ELECT DEFIB PAD ADLT CADENCE (PAD) ×2 IMPLANT
GLIDESHEATH SLEND A-KIT 6F 22G (SHEATH) ×2 IMPLANT
GUIDEWIRE INQWIRE 1.5J.035X260 (WIRE) IMPLANT
INQWIRE 1.5J .035X260CM (WIRE)
KIT ENCORE 26 ADVANTAGE (KITS) ×2 IMPLANT
KIT HEART LEFT (KITS) ×2 IMPLANT
PACK CARDIAC CATHETERIZATION (CUSTOM PROCEDURE TRAY) ×2 IMPLANT
SHEATH PINNACLE 5F 10CM (SHEATH) ×2 IMPLANT
SHEATH PINNACLE 6F 10CM (SHEATH) ×2 IMPLANT
SYR MEDRAD MARK V 150ML (SYRINGE) ×2 IMPLANT
TRANSDUCER W/STOPCOCK (MISCELLANEOUS) ×2 IMPLANT
TUBING CIL FLEX 10 FLL-RA (TUBING) ×2 IMPLANT
WIRE ASAHI FIELDER XT 190CM (WIRE) ×2 IMPLANT
WIRE ASAHI PROWATER 180CM (WIRE) ×4 IMPLANT
WIRE EMERALD 3MM-J .035X150CM (WIRE) ×2 IMPLANT
WIRE GUIDE ASAHI EXTENSION 165 (WIRE) ×2 IMPLANT
WIRE HI TORQ VERSACORE-J 145CM (WIRE) ×2 IMPLANT

## 2016-10-10 NOTE — Progress Notes (Signed)
Right femoral sheath pulled at 1330. Pt tolerated procedure well. No complications with sheath pull. Rt femoral site level zero. Will continue to monitor closely.   Kerrion Kemppainen N. Jamelle HaringSnow, RN

## 2016-10-10 NOTE — H&P (View-Only) (Signed)
     10/07/2016 Harry Cervantes   04/03/1976  [id number]  Primary Physician HOWELL,TRAVIS, MD Primary Cardiologist: Kyri Shader J Shanisha Lech MD FACP, FACC, FAHA, FSCAI  HPI:  Harry Cervantes is a delightful 41-year-old demented appearing married Caucasian male father of 5 children which accompany by his wife Harry Cervantes today. He was seen by Brittney Simmons [facility] as a new patient on 10/05/16. He was seen in the emergency room several days before for palpitations and chest pain. This was factors for heart disease include treated hypertension, discontinued tobacco abuse and family history. Father did have a myocardial infarction and stent. 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 His Myoview stress test performed yesterday revealed positive GXT with ischemia in the LAD territory and an ejection fraction of 43%.   Current Outpatient Prescriptions  Medication Sig Dispense Refill  . amLODipine (NORVASC) 10 MG tablet Take 10 mg by mouth daily.    . lisinopril (PRINIVIL,ZESTRIL) 10 MG tablet Take 10 mg by mouth daily.     No current facility-administered medications for this visit.     No Known Allergies  Social History   Social History  . Marital status: Single    Spouse name: N/A  . Number of children: N/A  . Years of education: N/A   Occupational History  . Not on file.   Social History Main Topics  . Smoking status: Former Smoker    Quit date: 02/03/2016  . Smokeless tobacco: Current User    Types: Snuff  . Alcohol use No  . Drug use: Unknown  . Sexual activity: Not on file   Other Topics Concern  . Not on file   Social History Narrative  . No narrative on file     Review of Systems: General: negative for chills, fever, night sweats or weight changes.  Cardiovascular: negative for chest pain, dyspnea on exertion, edema, orthopnea, palpitations, paroxysmal nocturnal dyspnea or shortness of breath Dermatological: negative for rash Respiratory: negative for cough or wheezing Urologic: negative  for hematuria Abdominal: negative for nausea, vomiting, diarrhea, bright red blood per rectum, melena, or hematemesis Neurologic: negative for visual changes, syncope, or dizziness All other systems reviewed and are otherwise negative except as noted above.    Blood pressure 126/83, pulse 78, height 6' (1.829 m), weight 232 lb (105.2 kg), SpO2 100 %.  General appearance: alert and no distress Neck: no adenopathy, no carotid bruit, no JVD, supple, symmetrical, trachea midline and thyroid not enlarged, symmetric, no tenderness/mass/nodules Lungs: clear to auscultation bilaterally Heart: regular rate and rhythm, S1, S2 normal, no murmur, click, rub or gallop Extremities: extremities normal, atraumatic, no cyanosis or edema  EKG not performed today  ASSESSMENT AND PLAN:   Chest pain with moderate risk for cardiac etiology Harry Cervantes returns for follow-up of his Myoview stress test that was performed yesterday. 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 I'm going to begin him on a baby aspirin and perform outpatient right radial cortical catheterization on him this coming Monday.The patient understands that risks included but are not limited to stroke (1 in 1000), death (1 in 1000), kidney failure [usually temporary] (1 in 500), bleeding (1 in 200), allergic reaction [possibly serious] (1 in 200). The patient understands and agrees to proceed      Derell Bruun J. Hawley Pavia MD FACP,FACC,FAHA, FSCAI 10/07/2016 10:42 AM 

## 2016-10-10 NOTE — Progress Notes (Signed)
  Echocardiogram 2D Echocardiogram has been performed.  Leta JunglingCooper, Omara Alcon M 10/10/2016, 3:08 PM

## 2016-10-10 NOTE — Interval H&P Note (Signed)
Cath Lab Visit (complete for each Cath Lab visit)  Clinical Evaluation Leading to the Procedure:   ACS: No.  Non-ACS:    Anginal Classification: CCS II  Anti-ischemic medical therapy: No Therapy  Non-Invasive Test Results: High-risk stress test findings: cardiac mortality >3%/year  Prior CABG: No previous CABG      History and Physical Interval Note:  10/10/2016 9:06 AM  Harry Cervantes  has presented today for surgery, with the diagnosis of cp  The various methods of treatment have been discussed with the patient and family. After consideration of risks, benefits and other options for treatment, the patient has consented to  Procedure(s): Left Heart Cath and Coronary Angiography (N/A) as a surgical intervention .  The patient's history has been reviewed, patient examined, no change in status, stable for surgery.  I have reviewed the patient's chart and labs.  Questions were answered to the patient's satisfaction.     Nanetta BattyBerry, Rogen Porte

## 2016-10-11 ENCOUNTER — Encounter (HOSPITAL_COMMUNITY): Payer: Self-pay | Admitting: *Deleted

## 2016-10-11 ENCOUNTER — Telehealth: Payer: Self-pay | Admitting: Cardiovascular Disease

## 2016-10-11 DIAGNOSIS — R002 Palpitations: Secondary | ICD-10-CM

## 2016-10-11 DIAGNOSIS — I10 Essential (primary) hypertension: Secondary | ICD-10-CM

## 2016-10-11 DIAGNOSIS — I25118 Atherosclerotic heart disease of native coronary artery with other forms of angina pectoris: Secondary | ICD-10-CM

## 2016-10-11 DIAGNOSIS — I25119 Atherosclerotic heart disease of native coronary artery with unspecified angina pectoris: Secondary | ICD-10-CM | POA: Diagnosis present

## 2016-10-11 DIAGNOSIS — R9439 Abnormal result of other cardiovascular function study: Secondary | ICD-10-CM

## 2016-10-11 LAB — BASIC METABOLIC PANEL
Anion gap: 8 (ref 5–15)
BUN: 10 mg/dL (ref 6–20)
CO2: 30 mmol/L (ref 22–32)
CREATININE: 1.15 mg/dL (ref 0.61–1.24)
Calcium: 8.8 mg/dL — ABNORMAL LOW (ref 8.9–10.3)
Chloride: 100 mmol/L — ABNORMAL LOW (ref 101–111)
Glucose, Bld: 107 mg/dL — ABNORMAL HIGH (ref 65–99)
Potassium: 3.9 mmol/L (ref 3.5–5.1)
SODIUM: 138 mmol/L (ref 135–145)

## 2016-10-11 LAB — ECHOCARDIOGRAM COMPLETE
HEIGHTINCHES: 72 in
Weight: 3600 oz

## 2016-10-11 LAB — CBC
HCT: 38.2 % — ABNORMAL LOW (ref 39.0–52.0)
Hemoglobin: 12.5 g/dL — ABNORMAL LOW (ref 13.0–17.0)
MCH: 29.3 pg (ref 26.0–34.0)
MCHC: 32.7 g/dL (ref 30.0–36.0)
MCV: 89.7 fL (ref 78.0–100.0)
Platelets: 211 10*3/uL (ref 150–400)
RBC: 4.26 MIL/uL (ref 4.22–5.81)
RDW: 13.2 % (ref 11.5–15.5)
WBC: 8.6 10*3/uL (ref 4.0–10.5)

## 2016-10-11 LAB — TROPONIN I: Troponin I: <0.03 ng/mL (ref <0.03)

## 2016-10-11 MED ORDER — METOPROLOL TARTRATE 25 MG PO TABS: 12.5000 mg | ORAL_TABLET | Freq: Two times a day (BID) | ORAL | 3 refills | Status: DC

## 2016-10-11 MED ORDER — AMLODIPINE BESYLATE 5 MG PO TABS: 5.0000 mg | ORAL_TABLET | Freq: Every day | ORAL | 3 refills | Status: AC

## 2016-10-11 MED ORDER — AMLODIPINE BESYLATE 5 MG PO TABS
5.0000 mg | ORAL_TABLET | Freq: Every day | ORAL | Status: DC
  Administered 2016-10-11: 5 mg via ORAL
  Filled 2016-10-11: qty 1

## 2016-10-11 MED ORDER — ATORVASTATIN CALCIUM 40 MG PO TABS: 40.0000 mg | ORAL_TABLET | Freq: Every day | ORAL | Status: DC

## 2016-10-11 MED ORDER — ATORVASTATIN CALCIUM 40 MG PO TABS: 40.0000 mg | ORAL_TABLET | Freq: Every day | ORAL | 3 refills | Status: DC

## 2016-10-11 MED ORDER — CALCIUM CARBONATE ANTACID 500 MG PO CHEW
1.0000 | CHEWABLE_TABLET | Freq: Three times a day (TID) | ORAL | Status: DC | PRN
  Administered 2016-10-11: 200 mg via ORAL
  Filled 2016-10-11: qty 1

## 2016-10-11 MED FILL — Nitroglycerin IV Soln 100 MCG/ML in D5W: INTRA_ARTERIAL | Qty: 10 | Status: AC

## 2016-10-11 NOTE — Progress Notes (Signed)
CARDIAC REHAB PHASE I   PRE:  Rate/Rhythm: 64 SR  BP:  Supine:   Sitting: 121/76  Standing:    SaO2:   MODE:  Ambulation: 940 ft   POST:  Rate/Rhythm: 78 SR  BP:  Supine:   Sitting: 108/69  Standing:    SaO2: 100%RA 1010-1110 Pt walked 940 ft with steady gait. Tolerated well. No CP or dizziness. Education completed with pt and wife who voiced understanding. Reviewed NTG use, risk factors including tobacco cessation with snuff use,ex ed and heart healthy diet. Discussed CRP 2 and order has been placed by [facility]Berry [facility] . Brochure given and encouraged pt to consider.  Luetta Nuttingharlene Toivo Bordon, RN BSN  10/11/2016 11:07 AM

## 2016-10-11 NOTE — Discharge Summary (Signed)
Discharge Summary    Patient ID: Harry Cervantes,  MRN: [id number], DOB/AGE: 1976/02/10 41 y.o.  Admit date: 10/10/2016 Discharge date: 10/11/2016  Primary Care Provider: HOWELL,TRAVIS Primary Cardiologist: Dr. Allyson Sabal   Discharge Diagnoses    Principal Problem:   Abnormal stress test Active Problems:   Palpitations   Hypertension   CAD (coronary artery disease)   Allergies No Known Allergies   History of Present Illness     Harry Cervantes is a 41 y.o. male with a history of HTN and previous tobacco abuse who presented to [facility] on 08[facility]09[facility]17 for planned cardiac cath given recent abnormal nuclear stress test.  He was seen as a new patient in the office by Robbie Lis PA-C and Dr. Allyson Sabal for evaluation of palpitations, chest pain and SOB. Exercise myoview was ordered which returned a high risk study consistent with extensive ischemia due to proximal LAD artery stenosis and mild-mod LV dysfunction. He was set up for coronary angiography on 10/10/16.   Hospital Course     Consultants: none  Abnormal stress test/CAD: cath revealed 99% mLAD, 90% mid-dist LAD, normal LV function (55-65%). Unsuccessful attempt at mid LAD intervention because of severity of disease and inability to cross the lesion successfully. Plan is for medical therapy. Per Dr. Eldridge Dace, consider CTO PCI down the road if sx return. Continue ASA, statin and BB.   CAD: doing well post cath. No angina. Medical therapy.   HTN: BP high at home, 150-160. Low here in hospital. Beta blocker started (metoprolol 12.5 BID).  Amlodipine decreased to 5 mg.  Continue Lisinopril 10mg  daily.  Meds can be uptitrated as an outpatient when his BP has increased.  The patient has had an uncomplicated hospital course and is recovering well. The femoral catheter site is stable He has been seen by Dr. Eldridge Dace today and deemed ready for discharge home. All follow-up appointments have been scheduled. A work excuse note was  provided as well. Discharge medications are listed below.  _____________  Discharge Vitals Blood pressure 98/68, pulse 61, temperature 97.5 F (36.4 C), temperature source Oral, resp. rate 14, height 6' (1.829 m), weight 225 lb (102.1 kg), SpO2 100 %.  Filed Weights   10/10/16 0712  Weight: 225 lb (102.1 kg)    Labs & Radiologic Studies     CBC  Recent Labs  10/11/16 0449  WBC 8.6  HGB 12.5*  HCT 38.2*  MCV 89.7  PLT 211   Basic Metabolic Panel  Recent Labs  10/11/16 0449  NA 138  K 3.9  CL 100*  CO2 30  GLUCOSE 107*  BUN 10  CREATININE 1.15  CALCIUM 8.8*   Liver Function Tests No results for input(s): AST, ALT, ALKPHOS, BILITOT, PROT, ALBUMIN in the last 72 hours. No results for input(s): LIPASE, AMYLASE in the last 72 hours. Cardiac Enzymes  Recent Labs  10/10/16 1200 10/10/16 1646 10/10/16 2326  TROPONINI <0.03 <0.03 <0.03     Dg Chest 2 View  Result Date: 10/02/2016 CLINICAL DATA:  Central chest pain EXAM: CHEST  2 VIEW COMPARISON:  06/30/2015 FINDINGS: Slight elevation of the left diaphragm. No acute consolidation or effusion. Normal heart size. No pneumothorax. IMPRESSION: No active cardiopulmonary disease. Electronically Signed   By: Jasmine Pang M.D.   On: 10/02/2016 21:10     Diagnostic Studies/Procedures    2D ECHO: 10/10/2016 LV EF: 55% -   60% Study Conclusions - Left ventricle: The cavity size was normal. Systolic function was   normal.  The estimated ejection fraction was in the range of 55%   to 60%. Wall motion was normal; there were no regional wall   motion abnormalities. The transmitral flow pattern was normal.   Left ventricular diastolic function parameters were normal. - Aortic valve: Trileaflet; mildly thickened, mildly calcified   leaflets. - Mitral valve: There was trivial regurgitation. - Pulmonary arteries: Systolic pressure could not be accurately   estimated.  _____________  10/10/16 Procedures  Left Heart  Cath and Coronary Angiography  Conclusion     Mid LAD lesion, 99 %stenosed.  Mid LAD to Dist LAD lesion, 90 %stenosed.  The left ventricular systolic function is normal.  LV end diastolic pressure is normal.  The left ventricular ejection fraction is 55-65% by visual estimate.       Disposition   Pt is being discharged home today in good condition.  Follow-up Plans & Appointments    Follow-up Information    Corine ShelterLuke Kilroy, [place]-C Follow up on 10/19/2016.   Specialties:  Cardiology, Radiology Why:  @ 8:30am, please arrive at least 10 minutes early Contact information: [address] [place] [address] [phone]          Discharge Instructions    AMB Referral to Cardiac Rehabilitation - Phase II    Complete by:  As directed    Diagnosis:  Stable Angina      Discharge Medications     Medication List    TAKE these medications   amLODipine 5 MG tablet Commonly known as:  NORVASC Take 1 tablet (5 mg total) by mouth daily. What changed:  medication strength  how much to take   aspirin EC 81 MG tablet Take 81 mg by mouth daily.   atorvastatin 40 MG tablet Commonly known as:  LIPITOR Take 1 tablet (40 mg total) by mouth daily at 6 PM.   lisinopril 10 MG tablet Commonly known as:  PRINIVIL,ZESTRIL Take 10 mg by mouth daily.   metoprolol tartrate 25 MG tablet Commonly known as:  LOPRESSOR Take 0.5 tablets (12.5 mg total) by mouth 2 (two) times daily.        Outstanding Labs/Studies   None   Duration of Discharge Encounter   Greater than 30 minutes including physician time.  Signed, Cline CrockKathryn Thompson PA-C 10/11/2016, 11:52 AM   I have examined the patient and reviewed assessment and plan and discussed with patient.  Agree with above as stated.  Medical therapy at this time.  Could consider CTO PCI attempt if he has sx on medical therapy.  Statin started.  BP meds adjusted as his BP was low in the hospital.  I suspect it will  increase over time.  May need to increase amlodipine back to 10 mg daily.  Beta blocker started as well.  Echo showed no effusion.    Discussed plan at length with the wife.  He will need a f/u next week as above.  Lance MussJayadeep Varanasi

## 2016-10-11 NOTE — Progress Notes (Signed)
Discharge instructions reviewed with and provided to patient, all questions answered.  All IV sites removed and all patient belongings returned to patient.

## 2016-10-11 NOTE — Discharge Instructions (Signed)

## 2016-10-11 NOTE — Telephone Encounter (Signed)
Received [place] [employer] [place] Forms for Dr Allyson SabalBerry to review, complete and sign.  Forms received via Fax on 10/07/16 however I did not received until 10/10/16.  No signed AUTH or Pmt to [employer].  Sent to [employer] on 10/11/16 via Courier for [employer] to send letter/pkt to patient to get signed AUTH/PMT. lp

## 2016-10-11 NOTE — Progress Notes (Signed)
Progress Note  Patient Name: Harry Cervantes Date of Encounter: 10/11/2016  Primary Cardiologist: Allyson Sabal  Subjective   No chest pain. Some low BP readings  Inpatient Medications    Scheduled Meds: . amLODipine  5 mg Oral Daily  . aspirin EC  81 mg Oral Daily  . lisinopril  10 mg Oral Daily  . metoprolol tartrate  12.5 mg Oral BID  . sodium chloride flush  3 mL Intravenous Q12H   Continuous Infusions:  PRN Meds: sodium chloride, acetaminophen, morphine injection, ondansetron (ZOFRAN) IV, sodium chloride flush   Vital Signs    Vitals:   10/11/16 0400 10/11/16 0600 10/11/16 0700 10/11/16 0800  BP: 90/61 90/61 97/64  97/67  Pulse: 62 (!) 57 (!) 55 (!) 57  Resp: 12 11 12 11   Temp: 97.9 F (36.6 C)   97.5 F (36.4 C)  TempSrc: Oral   Oral  SpO2: 95% 97% 96% 97%  Weight:      Height:        Intake/Output Summary (Last 24 hours) at 10/11/16 0937 Last data filed at 10/11/16 0800  Gross per 24 hour  Intake           1792.5 ml  Output             3700 ml  Net          -1907.5 ml   Filed Weights   10/10/16 0712  Weight: 225 lb (102.1 kg)    Telemetry    NSR - Personally Reviewed  ECG    NSR - Personally Reviewed  Physical Exam   GEN: No acute distress.   Neck: No JVD Cardiac: RRR, no murmurs, rubs, or gallops.  Respiratory: Clear to auscultation bilaterally. GI: Soft, nontender, non-distended  MS: No edema; No deformity. Neuro:  Nonfocal  Psych: Normal affect   Labs    Chemistry Recent Labs Lab 10/05/16 1047 10/11/16 0449  NA 140 138  K 4.5 3.9  CL 101 100*  CO2 31 30  GLUCOSE 84 107*  BUN 17 10  CREATININE 1.13 1.15  CALCIUM 9.1 8.8*  PROT 6.9  --   ALBUMIN 4.0  --   AST 17  --   ALT 16  --   ALKPHOS 57  --   BILITOT 0.6  --   GFRNONAA 80 >60  GFRAA >89 >60  ANIONGAP  --  8     Hematology Recent Labs Lab 10/05/16 1047 10/07/16 1147 10/11/16 0449  WBC 6.3 6.8 8.6  RBC 4.74 4.53 4.26  HGB 13.9 13.4 12.5*  HCT 42.5 40.4  38.2*  MCV 89.7 89.2 89.7  MCH 29.3 29.6 29.3  MCHC 32.7 33.2 32.7  RDW 13.5 13.5 13.2  PLT 242 234 211    Cardiac Enzymes Recent Labs Lab 10/10/16 1200 10/10/16 1646 10/10/16 2326  TROPONINI <0.03 <0.03 <0.03   No results for input(s): TROPIPOC in the last 168 hours.   BNPNo results for input(s): BNP, PROBNP in the last 168 hours.   DDimer No results for input(s): DDIMER in the last 168 hours.   Radiology    No results found.  Cardiac Studies   Cath results reviewed  Patient Profile     41 y.o. male withCTO of LAD, s/p unsuccessful attempt at PCI.  Assessment & Plan    1) CAD: Doing well. No angina.  Medical therapy.  COnsider CTO PCI down the road if sx return.  2) HTN: BP high at home, 150-160.  Low here in  hospital.  Beta blocker started.  Decrease amlodipine to 5 mg for now.  Start metoprolol 12.5 BID.  COntinue Lisinopril.  Meds can be uptitrated as an outpatient when his BP has increased.  Plan discharge later today if he does well walking.  Signed, Lance MussJayadeep Ludia Gartland, MD  10/11/2016, 9:37 AM

## 2016-10-11 NOTE — Progress Notes (Signed)
Patient ambulated 1000 feet, no complaints during ambulation and vital signs stable.

## 2016-10-12 ENCOUNTER — Ambulatory Visit: Payer: Managed Care, Other (non HMO) | Admitting: Physician Assistant

## 2016-10-12 ENCOUNTER — Telehealth: Payer: Self-pay | Admitting: Cardiovascular Disease

## 2016-10-12 MED ORDER — CLOPIDOGREL BISULFATE 75 MG PO TABS: 75.0000 mg | ORAL_TABLET | Freq: Every day | ORAL | 1 refills | Status: DC

## 2016-10-12 MED ORDER — NITROGLYCERIN 0.4 MG SL SUBL: 0.4000 mg | SUBLINGUAL_TABLET | SUBLINGUAL | 3 refills | Status: DC | PRN

## 2016-10-12 NOTE — Telephone Encounter (Signed)
Advised OK to take both meds together, already reviewed by physician. Pt voiced understanding and thanks.

## 2016-10-12 NOTE — Telephone Encounter (Addendum)
Spoke to patient and discussed care. Advised per my discussion w Dr. Hazle CocaBerry's nurse, OK to start the plavix as well as the NTG. Plavix Rx written for standard start dose and daily dosing. NTG Rx sent as well, and use of these medications, as well as potential SEs, have been discussed w patient.  He also noted some intermittent GI issues, "heartburn", since having cath. States he experiences these symptoms at mealtime. Notes this started with the injection of the cath contrast and he's continued to have this for the past couple of days. Was concerned and didn't know if any medications were warranted. He's been taking generic Pepcid w/ some relief.  Sent to Dr. Allyson SabalBerry for any additional advice.

## 2016-10-12 NOTE — Addendum Note (Signed)
Addended by: Ladell HeadsOSE, Venesha Petraitis R on: 10/12/2016 12:18 PM   Modules accepted: Orders

## 2016-10-12 NOTE — Telephone Encounter (Signed)
Please advise.  Patient released from hospital, did not get prescription for Plavix, but was discussed by discharging RN - does he need this? Looks like he was not able to be stented due to severity of disease. Can also call in NTG SL for him if OK w MD - will discuss use of this medication w patient.

## 2016-10-12 NOTE — Telephone Encounter (Signed)
New message      Pt c/o medication issue:  1. Name of Medication: aspirin  2. How are you currently taking this medication (dosage and times per day)? Once daily  3. Are you having a reaction (difficulty breathing--STAT)? no  4. What is your medication issue? Pt calling to see if he should continue taking aspirin or stop since he was prescribed plavix.

## 2016-10-12 NOTE — Telephone Encounter (Signed)
New Message    Pt was discharged yesterday and was told he would get prescription for Plavix and nitro and the nurses did not have a prescription for this, said to call you today

## 2016-10-12 NOTE — Telephone Encounter (Signed)
Spoke with Dr. Allyson SabalBerry. He would like Harry Cervantes to start Plavix.

## 2016-10-12 NOTE — Telephone Encounter (Signed)
close

## 2016-10-13 ENCOUNTER — Ambulatory Visit: Payer: Managed Care, Other (non HMO) | Admitting: Cardiology

## 2016-10-14 ENCOUNTER — Encounter: Payer: Self-pay | Admitting: Cardiology

## 2016-10-14 ENCOUNTER — Telehealth: Payer: Self-pay

## 2016-10-14 NOTE — Telephone Encounter (Signed)
10/14/2016 Received [place] Disability Form [place] from [place] [employer] for Harry Cervantes to fill out forms was given to Nichelle to give to her.  cbr

## 2016-10-18 ENCOUNTER — Encounter: Payer: Self-pay | Admitting: Cardiology

## 2016-10-18 ENCOUNTER — Encounter: Payer: Self-pay | Admitting: *Deleted

## 2016-10-18 ENCOUNTER — Ambulatory Visit (INDEPENDENT_AMBULATORY_CARE_PROVIDER_SITE_OTHER): Payer: Managed Care, Other (non HMO) | Admitting: Cardiology

## 2016-10-18 VITALS — BP 119/80 | HR 63 | Ht 72.0 in | Wt 231.6 lb

## 2016-10-18 DIAGNOSIS — I10 Essential (primary) hypertension: Secondary | ICD-10-CM

## 2016-10-18 DIAGNOSIS — I2510 Atherosclerotic heart disease of native coronary artery without angina pectoris: Secondary | ICD-10-CM

## 2016-10-18 DIAGNOSIS — Z79899 Other long term (current) drug therapy: Secondary | ICD-10-CM

## 2016-10-18 DIAGNOSIS — Z8249 Family history of ischemic heart disease and other diseases of the circulatory system: Secondary | ICD-10-CM | POA: Diagnosis not present

## 2016-10-18 MED ORDER — ROSUVASTATIN CALCIUM 20 MG PO TABS: 20.0000 mg | ORAL_TABLET | Freq: Every day | ORAL | 5 refills | Status: DC

## 2016-10-18 NOTE — Assessment & Plan Note (Signed)
Father had an MI in his 50's 

## 2016-10-18 NOTE — Assessment & Plan Note (Signed)
Controlled.  

## 2016-10-18 NOTE — Assessment & Plan Note (Signed)
a. 09/2016: abn stress test, cath revealed 99% mLAD, 90% mid-dist LAD with some R-Lt collaterals, normal LV function (55-65%). Unsuccessful attempt at mid LAD intervention because of severity of disease and inability to cross the lesion successfully. Plan is for Rx therapy

## 2016-10-18 NOTE — Patient Instructions (Signed)
Medication Instructions:  STOP ATORVASTATIN   START CRESTOR (ROSUVASTATIN) 20 MG DAILY  Labwork: FASTING LP/CMET IN 6 WEEKS AT [facility] ON THE FIRST FLOOR  Testing/Procedures: NONE  Follow-Up: Your physician recommends that you schedule a follow-up appointment in: 3 MONTH OV WITH DR Allyson SabalBERRY   If you need a refill on your cardiac medications before your next appointment, please call your pharmacy.

## 2016-10-18 NOTE — Progress Notes (Signed)
10/18/2016 Harry Cervantes   [birth year]  [id number]  Primary Physician Marthenia Rolling, MD Primary Cardiologist: Dr Allyson Sabal  HPI:  41 y/o married male, works for [employer], seen in the office 10/05/16 with chest pain. OP Myoview was done 10/06/16 and was abnormal suggesting LAD ischemia and an EF of 41%. He was admitted for OP cath 10/10/16. This revealed 99% mLAD, 90% mid-dist LAD, normal LV function (55-65%). Unsuccessful attempt at mid LAD intervention because of severity of disease and inability to cross the lesion successfully. Plan is for medical therapy. Per Dr. Eldridge Dace, consider CTO PCI down the road if sx return.   He is in the office today for follow up. He has been doing well, no angina. He is not back to work yet. He and his wife had several questions and I tried to answer all of them. I explained the anatomy of CTO and collateral circulation. I reassured him his LVF was normal and our goal was to have him be active and do what he wants and if he chest pain we need to know about it.    Current Outpatient Prescriptions  Medication Sig Dispense Refill  . amLODipine (NORVASC) 5 MG tablet Take 1 tablet (5 mg total) by mouth daily. 5 tablet 3  . aspirin EC 81 MG tablet Take 81 mg by mouth daily.    . Cholecalciferol (VITAMIN D3) 2000 units TABS Take 2,000 Units by mouth daily.    . clopidogrel (PLAVIX) 75 MG tablet Take 75 mg by mouth daily.  1  . lisinopril (PRINIVIL,ZESTRIL) 10 MG tablet Take 10 mg by mouth daily.    . metoprolol tartrate (LOPRESSOR) 25 MG tablet Take 0.5 tablets (12.5 mg total) by mouth 2 (two) times daily. 60 tablet 3  . nitroGLYCERIN (NITROSTAT) 0.4 MG SL tablet Place 1 tablet (0.4 mg total) under the tongue every 5 (five) minutes as needed for chest pain. 25 tablet 3  . ranitidine (ZANTAC) 150 MG tablet Take 150 mg by mouth 2 (two) times daily.  0  . rosuvastatin (CRESTOR) 20 MG tablet Take 1 tablet (20 mg total) by mouth daily. 30 tablet 5   No current  facility-administered medications for this visit.     No Known Allergies  Social History   Social History  . Marital status: Single    Spouse name: N/A  . Number of children: N/A  . Years of education: N/A   Occupational History  . Not on file.   Social History Main Topics  . Smoking status: Former Smoker    Quit date: 02/03/2016  . Smokeless tobacco: Current User    Types: Snuff  . Alcohol use No  . Drug use: Unknown  . Sexual activity: Not on file   Other Topics Concern  . Not on file   Social History Narrative  . No narrative on file     Review of Systems: General: negative for chills, fever, night sweats or weight changes.  Cardiovascular: negative for chest pain, dyspnea on exertion, edema, orthopnea, palpitations, paroxysmal nocturnal dyspnea or shortness of breath Dermatological: negative for rash Respiratory: negative for cough or wheezing Urologic: negative for hematuria Abdominal: negative for nausea, vomiting, diarrhea, bright red blood per rectum, melena, or hematemesis Neurologic: negative for visual changes, syncope, or dizziness All other systems reviewed and are otherwise negative except as noted above.    Blood pressure 119/80, pulse 63, height 6' (1.829 m), weight 231 lb 9.6 oz (105.1 kg).  General appearance: alert, cooperative and  no distress Neck: no carotid bruit and no JVD Lungs: clear to auscultation bilaterally Heart: regular rate and rhythm Extremities: extensive Rg hip, groin, and thigh ecchymosis, no hematom or bruit Skin: Skin color, texture, turgor normal. No rashes or lesions Neurologic: Grossly normal    ASSESSMENT AND PLAN:   CAD (coronary artery disease) a. 09/2016: abn stress test, cath revealed 99% mLAD, 90% mid-dist LAD with some R-Lt collaterals, normal LV function (55-65%). Unsuccessful attempt at mid LAD intervention because of severity of disease and inability to cross the lesion successfully. Plan is for Rx  therapy  Hypertension Controlled  Family history of coronary artery disease in father Father had an MI in his 6450's   PLAN  I did change his statin from Lipitor to Crestor in light of recent reports of possible long term side effects of statins that cross the blood brain barrier. He'll need f/u CMET and lipids in 6 weeks and a f/u with Dr Allyson SabalBerry.    Corine ShelterLuke Amirah Goerke PA-C 10/18/2016 4:50 PM

## 2016-10-20 ENCOUNTER — Telehealth (HOSPITAL_COMMUNITY): Payer: Self-pay | Admitting: Family Medicine

## 2016-10-20 NOTE — Telephone Encounter (Addendum)
I verified patient [employer] benefits on 10/13/16. No co-payment, deductible $3000.00/$2,476.32 has been met, out of pocket max $5,000.00/$4,476.32 has been met, co-insurance 20%, 36 sessions covered. Per [employer] website cardiac rehab needs pre authorization before patient is seen. P[facility] aware and will follow up on pre authorization. Passport/reference # [id number]. Also verified through [employer].

## 2016-10-24 ENCOUNTER — Telehealth (HOSPITAL_COMMUNITY): Payer: Self-pay | Admitting: Cardiac Rehabilitation

## 2016-10-24 NOTE — Telephone Encounter (Signed)
-----   Message from Carmelina PaddockAshley N Wertz sent at 10/24/2016  9:40 AM EST ----- Regarding: cardiac rehab No precert required for cardiac rehab.   Reference # [id number] per [employer]igna automated system.  Thank you!  Morrie SheldonAshley

## 2016-10-25 ENCOUNTER — Encounter: Payer: Self-pay | Admitting: Cardiovascular Disease

## 2016-10-28 ENCOUNTER — Telehealth (HOSPITAL_COMMUNITY): Payer: Self-pay | Admitting: Family Medicine

## 2016-10-28 ENCOUNTER — Telehealth (HOSPITAL_COMMUNITY): Payer: Self-pay | Admitting: Cardiac Rehabilitation

## 2016-10-28 NOTE — Telephone Encounter (Signed)
S/w pt advising no pre-cert and that he had no co-pay and only 20% co-insurance of the balance of $771.59. Left msg on pt's contact Delice Bisonara same information and that I spoke with Cristal Deerhristopher.  [facility] Reference # [id number].... KJ

## 2016-10-28 NOTE — Telephone Encounter (Signed)
Phone message received from Marnee Springara Fraley, pt spouse, requesting precertification information.    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  LMVM no precert necessary.  Also request return phone call for nursing interview.

## 2016-11-01 ENCOUNTER — Inpatient Hospital Stay (HOSPITAL_COMMUNITY): Admission: RE | Admit: 2016-11-01 | Payer: Managed Care, Other (non HMO) | Source: Ambulatory Visit

## 2016-11-07 ENCOUNTER — Inpatient Hospital Stay (HOSPITAL_COMMUNITY): Admission: RE | Admit: 2016-11-07 | Payer: Managed Care, Other (non HMO) | Source: Ambulatory Visit

## 2016-11-09 ENCOUNTER — Ambulatory Visit (HOSPITAL_COMMUNITY): Payer: Managed Care, Other (non HMO)

## 2016-11-11 ENCOUNTER — Ambulatory Visit (HOSPITAL_COMMUNITY): Payer: Managed Care, Other (non HMO)

## 2016-11-14 ENCOUNTER — Inpatient Hospital Stay (HOSPITAL_COMMUNITY): Admission: RE | Admit: 2016-11-14 | Payer: Managed Care, Other (non HMO) | Source: Ambulatory Visit

## 2016-11-16 ENCOUNTER — Telehealth (HOSPITAL_COMMUNITY): Payer: Self-pay | Admitting: Cardiac Rehabilitation

## 2016-11-16 ENCOUNTER — Ambulatory Visit (HOSPITAL_COMMUNITY): Payer: Managed Care, Other (non HMO)

## 2016-11-16 NOTE — Telephone Encounter (Signed)
Late entry for 11/01/2016  Pt no show for cardiac rehab orientation.  Lm for pt to return call to reschedule.

## 2016-11-18 ENCOUNTER — Ambulatory Visit (HOSPITAL_COMMUNITY): Payer: Managed Care, Other (non HMO)

## 2016-11-21 ENCOUNTER — Ambulatory Visit (HOSPITAL_COMMUNITY): Payer: Managed Care, Other (non HMO)

## 2016-11-22 ENCOUNTER — Encounter: Payer: Self-pay | Admitting: *Deleted

## 2016-11-23 ENCOUNTER — Ambulatory Visit (HOSPITAL_COMMUNITY): Payer: Managed Care, Other (non HMO)

## 2016-11-25 ENCOUNTER — Ambulatory Visit (HOSPITAL_COMMUNITY): Payer: Managed Care, Other (non HMO)

## 2016-11-28 ENCOUNTER — Ambulatory Visit (HOSPITAL_COMMUNITY): Payer: Managed Care, Other (non HMO)

## 2016-11-30 ENCOUNTER — Ambulatory Visit (HOSPITAL_COMMUNITY): Payer: Managed Care, Other (non HMO)

## 2016-11-30 ENCOUNTER — Telehealth: Payer: Self-pay | Admitting: Cardiovascular Disease

## 2016-11-30 NOTE — Telephone Encounter (Signed)
Spoke to patient   informed patient lab order are int he system - just let the tech  Know - per [facility] - take id ,and insurance card Result of monitor given.  verbalized understanding of both

## 2016-11-30 NOTE — Telephone Encounter (Signed)
Pt says he needs a lab order sent to [facility] in [place] please.He has an appointment to see Dr Allyson Sabal in May.He also received a letter in the mail saying that they have been trying to get him to give him test results. Pt says he have not had any test recently,please call him.

## 2016-11-30 NOTE — Telephone Encounter (Signed)
Follow up   Pt said he missed a call, it rang once then hung up.

## 2016-12-01 LAB — COMPREHENSIVE METABOLIC PANEL
ALT: 19 U/L (ref 9–46)
AST: 19 U/L (ref 10–40)
Albumin: 3.9 g/dL (ref 3.6–5.1)
Alkaline Phosphatase: 64 U/L (ref 40–115)
BUN: 10 mg/dL (ref 7–25)
CO2: 29 mmol/L (ref 20–31)
Calcium: 8.7 mg/dL (ref 8.6–10.3)
Chloride: 106 mmol/L (ref 98–110)
Creat: 0.97 mg/dL (ref 0.60–1.35)
Glucose, Bld: 81 mg/dL (ref 65–99)
Potassium: 4.4 mmol/L (ref 3.5–5.3)
Sodium: 141 mmol/L (ref 135–146)
Total Bilirubin: 0.6 mg/dL (ref 0.2–1.2)
Total Protein: 6.4 g/dL (ref 6.1–8.1)

## 2016-12-01 LAB — LIPID PANEL
Cholesterol: 119 mg/dL (ref <200)
HDL: 38 mg/dL — ABNORMAL LOW (ref >40)
LDL Cholesterol: 71 mg/dL (ref <100)
Total CHOL/HDL Ratio: 3.1 Ratio (ref <5.0)
Triglycerides: 50 mg/dL (ref <150)
VLDL: 10 mg/dL (ref <30)

## 2016-12-02 ENCOUNTER — Ambulatory Visit (HOSPITAL_COMMUNITY): Payer: Managed Care, Other (non HMO)

## 2016-12-05 ENCOUNTER — Ambulatory Visit (HOSPITAL_COMMUNITY): Payer: Managed Care, Other (non HMO)

## 2016-12-07 ENCOUNTER — Ambulatory Visit (HOSPITAL_COMMUNITY): Payer: Managed Care, Other (non HMO)

## 2016-12-08 ENCOUNTER — Encounter (HOSPITAL_COMMUNITY): Payer: Self-pay | Admitting: Family Medicine

## 2016-12-08 NOTE — Progress Notes (Signed)
Mailed letter with our CRP to pt... KJ °

## 2016-12-09 ENCOUNTER — Ambulatory Visit (HOSPITAL_COMMUNITY): Payer: Managed Care, Other (non HMO)

## 2016-12-12 ENCOUNTER — Ambulatory Visit (HOSPITAL_COMMUNITY): Payer: Managed Care, Other (non HMO)

## 2016-12-14 ENCOUNTER — Ambulatory Visit (HOSPITAL_COMMUNITY): Payer: Managed Care, Other (non HMO)

## 2016-12-16 ENCOUNTER — Ambulatory Visit (HOSPITAL_COMMUNITY): Payer: Managed Care, Other (non HMO)

## 2016-12-19 ENCOUNTER — Ambulatory Visit (HOSPITAL_COMMUNITY): Payer: Managed Care, Other (non HMO)

## 2016-12-21 ENCOUNTER — Ambulatory Visit (HOSPITAL_COMMUNITY): Payer: Managed Care, Other (non HMO)

## 2016-12-23 ENCOUNTER — Ambulatory Visit (HOSPITAL_COMMUNITY): Payer: Managed Care, Other (non HMO)

## 2016-12-26 ENCOUNTER — Ambulatory Visit (HOSPITAL_COMMUNITY): Payer: Managed Care, Other (non HMO)

## 2016-12-28 ENCOUNTER — Ambulatory Visit (HOSPITAL_COMMUNITY): Payer: Managed Care, Other (non HMO)

## 2016-12-30 ENCOUNTER — Ambulatory Visit (HOSPITAL_COMMUNITY): Payer: Managed Care, Other (non HMO)

## 2017-01-02 ENCOUNTER — Ambulatory Visit (HOSPITAL_COMMUNITY): Payer: Managed Care, Other (non HMO)

## 2017-01-04 ENCOUNTER — Ambulatory Visit (HOSPITAL_COMMUNITY): Payer: Managed Care, Other (non HMO)

## 2017-01-06 ENCOUNTER — Ambulatory Visit (HOSPITAL_COMMUNITY): Payer: Managed Care, Other (non HMO)

## 2017-01-09 ENCOUNTER — Ambulatory Visit (HOSPITAL_COMMUNITY): Payer: Managed Care, Other (non HMO)

## 2017-01-10 ENCOUNTER — Ambulatory Visit: Payer: Managed Care, Other (non HMO) | Admitting: Cardiovascular Disease

## 2017-01-11 ENCOUNTER — Ambulatory Visit (HOSPITAL_COMMUNITY): Payer: Managed Care, Other (non HMO)

## 2017-01-13 ENCOUNTER — Encounter: Payer: Self-pay | Admitting: Cardiovascular Disease

## 2017-01-13 ENCOUNTER — Ambulatory Visit (HOSPITAL_COMMUNITY): Payer: Managed Care, Other (non HMO)

## 2017-01-13 ENCOUNTER — Ambulatory Visit (INDEPENDENT_AMBULATORY_CARE_PROVIDER_SITE_OTHER): Payer: Managed Care, Other (non HMO) | Admitting: Cardiovascular Disease

## 2017-01-13 DIAGNOSIS — I2510 Atherosclerotic heart disease of native coronary artery without angina pectoris: Secondary | ICD-10-CM | POA: Diagnosis not present

## 2017-01-13 DIAGNOSIS — E7800 Pure hypercholesterolemia, unspecified: Secondary | ICD-10-CM

## 2017-01-13 DIAGNOSIS — I10 Essential (primary) hypertension: Secondary | ICD-10-CM

## 2017-01-13 DIAGNOSIS — E785 Hyperlipidemia, unspecified: Secondary | ICD-10-CM | POA: Insufficient documentation

## 2017-01-13 NOTE — Assessment & Plan Note (Signed)
History of hyperlipidemia on high-dose statin therapy with recent lipid profile performed 12/01/16 revealed total cholesterol 119, LDL 71 and HDL of 38.

## 2017-01-13 NOTE — Progress Notes (Signed)
01/13/2017 Harry Cervantes   21-Jun-1976  [id number]  Primary Physician Marthenia Rolling, MD Primary Cardiologist: Harry Gess MD [name]  HPI:  Harry Cervantes is a delightful 41 year old demented appearing married Caucasian male father of 5 children who I last saw in the office 10/07/16. He was seen by [name] [facility] as a new patient on 10/05/16. He was seen in the emergency room several days before for palpitations and chest pain. This was factors for heart disease include treated hypertension, discontinued tobacco abuse and family history. Father did have a myocardial infarction and stent. 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 His Myoview stress test performed 10/06/16 revealed positive GXT with ischemia in the LAD territory and an ejection fraction of 43%. 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 I wire became subintimal and was never able to reenter the true lumen. An echocardiogram performed on the same day revealed normal LV function without focal wall motion abnormalities and without pericardial effusion. He was seen post discharge by Corine Shelter several days later, 10/18/16, and was doing well. He has only had one episode of mild angina since that time with significant exertion. The consensus was medical therapy unless he had recurrent symptoms which case he would be a candidate for CTO intervention by Drs. [place] and [place].   Current Outpatient Prescriptions  Medication Sig Dispense Refill  . amLODipine (NORVASC) 5 MG tablet Take 1 tablet (5 mg total) by mouth daily. 5 tablet 3  . aspirin EC 81 MG tablet Take 81 mg by mouth daily.    . Cholecalciferol (VITAMIN D3) 2000 units TABS Take 2,000 Units by mouth daily.    . clopidogrel (PLAVIX) 75 MG tablet Take 75 mg by mouth daily.  1  . lisinopril (PRINIVIL,ZESTRIL) 10 MG tablet Take 10 mg by mouth  daily.    . metoprolol tartrate (LOPRESSOR) 25 MG tablet Take 0.5 tablets (12.5 mg total) by mouth 2 (two) times daily. 60 tablet 3  . nitroGLYCERIN (NITROSTAT) 0.4 MG SL tablet Place 0.4 mg under the tongue every 5 (five) minutes as needed for chest pain.    . rosuvastatin (CRESTOR) 20 MG tablet Take 1 tablet (20 mg total) by mouth daily. 30 tablet 5   No current facility-administered medications for this visit.     No Known Allergies  Social History   Social History  . Marital status: Single    Spouse name: N/A  . Number of children: N/A  . Years of education: N/A   Occupational History  . Not on file.   Social History Main Topics  . Smoking status: Former Smoker    Quit date: 02/03/2016  . Smokeless tobacco: Current User    Types: Snuff  . Alcohol use No  . Drug use: Unknown  . Sexual activity: Not on file   Other Topics Concern  . Not on file   Social History Narrative  . No narrative on file     Review of Systems: General: negative for chills, fever, night sweats or weight changes.  Cardiovascular: negative for chest pain, dyspnea on exertion, edema, orthopnea, palpitations, paroxysmal nocturnal dyspnea or shortness of breath Dermatological: negative for rash Respiratory: negative for cough or wheezing Urologic: negative for hematuria Abdominal: negative for nausea, vomiting, diarrhea, bright red blood per rectum, melena, or hematemesis Neurologic: negative for visual changes, syncope, or dizziness  All other systems reviewed and are otherwise negative except as noted above.    Blood pressure 127/86, pulse 67, height 6' (1.829 m), weight 242 lb (109.8 kg), SpO2 99 %.  General appearance: alert and no distress Neck: no adenopathy, no carotid bruit, no JVD, supple, symmetrical, trachea midline and thyroid not enlarged, symmetric, no tenderness/mass/nodules Lungs: clear to auscultation bilaterally Heart: regular rate and rhythm, S1, S2 normal, no murmur, click, rub  or gallop Extremities: extremities normal, atraumatic, no cyanosis or edema  EKG not performed today  ASSESSMENT AND PLAN:   Hypertension History of hypertension with blood pressures measured 119/80. He is on amlodipine, metoprolol and lisinopril. Continue current meds at current dosing  Hyperlipidemia History of hyperlipidemia on high-dose statin therapy with recent lipid profile performed 12/01/16 revealed total cholesterol 119, LDL 71 and HDL of 38.  CAD (coronary artery disease) History of CAD with a positive stress test in February this year which led to cardiac catheterization that I performed 10/10/16. 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 The procedure was ultimately terminated. Postprocedure echo showed no effusion and normal LV function. His medicines were adjusted. He's only had one episode of chest tightness since that procedure. The consensus was medical therapy unless he had recurrent symptoms which case I would refer him to Dr. Eldridge DaceVaranasi for attempt at CTO intervention.      Harry GessJonathan J. Berry MD FACP,FACC,FAHA, [facility] 01/13/2017 8:02 AM

## 2017-01-13 NOTE — Patient Instructions (Signed)

## 2017-01-13 NOTE — Assessment & Plan Note (Signed)
History of CAD with a positive stress test in February this year which led to cardiac catheterization that I performed 10/10/16. 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 The procedure was ultimately terminated. Postprocedure echo showed no effusion and normal LV function. His medicines were adjusted. He's only had one episode of chest tightness since that procedure. The consensus was medical therapy unless he had recurrent symptoms which case I would refer him to Dr. Eldridge DaceVaranasi for attempt at CTO intervention.

## 2017-01-13 NOTE — Assessment & Plan Note (Signed)
History of hypertension with blood pressures measured 119/80. He is on amlodipine, metoprolol and lisinopril. Continue current meds at current dosing

## 2017-01-16 ENCOUNTER — Ambulatory Visit (HOSPITAL_COMMUNITY): Payer: Managed Care, Other (non HMO)

## 2017-01-18 ENCOUNTER — Ambulatory Visit (HOSPITAL_COMMUNITY): Payer: Managed Care, Other (non HMO)

## 2017-01-20 ENCOUNTER — Ambulatory Visit (HOSPITAL_COMMUNITY): Payer: Managed Care, Other (non HMO)

## 2017-01-23 ENCOUNTER — Ambulatory Visit (HOSPITAL_COMMUNITY): Payer: Managed Care, Other (non HMO)

## 2017-01-25 ENCOUNTER — Ambulatory Visit (HOSPITAL_COMMUNITY): Payer: Managed Care, Other (non HMO)

## 2017-01-27 ENCOUNTER — Ambulatory Visit (HOSPITAL_COMMUNITY): Payer: Managed Care, Other (non HMO)

## 2017-01-30 ENCOUNTER — Ambulatory Visit (HOSPITAL_COMMUNITY): Payer: Managed Care, Other (non HMO)

## 2017-02-01 ENCOUNTER — Ambulatory Visit (HOSPITAL_COMMUNITY): Payer: Managed Care, Other (non HMO)

## 2017-02-03 ENCOUNTER — Ambulatory Visit (HOSPITAL_COMMUNITY): Payer: Managed Care, Other (non HMO)

## 2017-02-06 ENCOUNTER — Ambulatory Visit (HOSPITAL_COMMUNITY): Payer: Managed Care, Other (non HMO)

## 2017-02-08 ENCOUNTER — Ambulatory Visit (HOSPITAL_COMMUNITY): Payer: Managed Care, Other (non HMO)

## 2017-02-10 ENCOUNTER — Ambulatory Visit (HOSPITAL_COMMUNITY): Payer: Managed Care, Other (non HMO)

## 2017-02-13 ENCOUNTER — Ambulatory Visit (HOSPITAL_COMMUNITY): Payer: Managed Care, Other (non HMO)

## 2017-02-15 ENCOUNTER — Ambulatory Visit (HOSPITAL_COMMUNITY): Payer: Managed Care, Other (non HMO)

## 2017-02-17 ENCOUNTER — Ambulatory Visit (HOSPITAL_COMMUNITY): Payer: Managed Care, Other (non HMO)

## 2017-02-20 ENCOUNTER — Ambulatory Visit (HOSPITAL_COMMUNITY): Payer: Managed Care, Other (non HMO)

## 2017-02-22 ENCOUNTER — Ambulatory Visit (INDEPENDENT_AMBULATORY_CARE_PROVIDER_SITE_OTHER): Payer: Managed Care, Other (non HMO) | Admitting: Cardiovascular Disease

## 2017-02-22 ENCOUNTER — Encounter: Payer: Self-pay | Admitting: Cardiovascular Disease

## 2017-02-22 ENCOUNTER — Ambulatory Visit (HOSPITAL_COMMUNITY): Payer: Managed Care, Other (non HMO)

## 2017-02-22 VITALS — BP 118/78 | HR 60 | Ht 72.0 in | Wt 243.0 lb

## 2017-02-22 DIAGNOSIS — I10 Essential (primary) hypertension: Secondary | ICD-10-CM | POA: Diagnosis not present

## 2017-02-22 DIAGNOSIS — I2510 Atherosclerotic heart disease of native coronary artery without angina pectoris: Secondary | ICD-10-CM

## 2017-02-22 MED ORDER — ISOSORBIDE MONONITRATE ER 30 MG PO TB24: 15.0000 mg | ORAL_TABLET | Freq: Every day | ORAL | 3 refills | Status: DC

## 2017-02-22 NOTE — Progress Notes (Signed)
Harry Cervantes Kitchenj    02/22/2017 Harry Cervantes   10-Jan-1976  [id number]  Primary Physician Harry Deed, DO Primary Cardiologist: Harry Gess MD Harry Cervantes  HPI:   [name] is a delightful 41 year old demented appearing married Caucasian male father of 5 children who I last saw in the office 01/13/17. He was seen by Harry Cervantes [facility] as a new patient on 10/05/16. He was seen in the emergency room several days before for palpitations and chest pain. This was factors for heart disease include treated hypertension, discontinued tobacco abuse and family history. Father did have a myocardial infarction and stent. 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 His Myoview stress test performed 10/06/16 revealed positive GXT with ischemia in the LAD territory and an ejection fraction of 43%. 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 I wire became subintimal and was never able to reenter the true lumen. An echocardiogram performed on the same day revealed normal LV function without focal wall motion abnormalities and without pericardial effusion. He was seen post discharge by Harry Cervantes several days later, 10/18/16, and was doing well. Since I saw him back in approximately 6 weeks ago he does admit to some dyspnea on exertion and occasional episodes of exertional angina but has not had to take a sublingual nitroglycerin. I am going to refer him to Dr. [place] to discuss the possibility of mid LAD CTO intervention.   Current Outpatient Prescriptions  Medication Sig Dispense Refill  . amLODipine (NORVASC) 5 MG tablet Take 1 tablet (5 mg total) by mouth daily. 5 tablet 3  . aspirin EC 81 MG tablet Take 81 mg by mouth daily.    . Cholecalciferol (VITAMIN D3) 2000 units TABS Take 2,000 Units by mouth daily.    . clopidogrel (PLAVIX) 75 MG tablet Take 75 mg by mouth daily.  1  .  lisinopril (PRINIVIL,ZESTRIL) 10 MG tablet Take 10 mg by mouth daily.    . metoprolol tartrate (LOPRESSOR) 25 MG tablet Take 0.5 tablets (12.5 mg total) by mouth 2 (two) times daily. 60 tablet 3  . nitroGLYCERIN (NITROSTAT) 0.4 MG SL tablet Place 0.4 mg under the tongue every 5 (five) minutes as needed for chest pain.    . isosorbide mononitrate (IMDUR) 30 MG 24 hr tablet Take 0.5 tablets (15 mg total) by mouth daily. 45 tablet 3  . rosuvastatin (CRESTOR) 20 MG tablet Take 1 tablet (20 mg total) by mouth daily. 30 tablet 5   No current facility-administered medications for this visit.     No Known Allergies  Social History   Social History  . Marital status: Single    Spouse name: N/A  . Number of children: N/A  . Years of education: N/A   Occupational History  . Not on file.   Social History Main Topics  . Smoking status: Former Smoker    Quit date: 02/03/2016  . Smokeless tobacco: Current User    Types: Snuff  . Alcohol use No  . Drug use: Unknown  . Sexual activity: Not on file   Other Topics Concern  . Not on file   Social History Narrative  . No narrative on file     Review of Systems: General: negative for chills, fever, night sweats or weight changes.  Cardiovascular: negative for chest pain, dyspnea on exertion, edema, orthopnea, palpitations, paroxysmal nocturnal dyspnea or shortness of breath  Dermatological: negative for rash Respiratory: negative for cough or wheezing Urologic: negative for hematuria Abdominal: negative for nausea, vomiting, diarrhea, bright red blood per rectum, melena, or hematemesis Neurologic: negative for visual changes, syncope, or dizziness All other systems reviewed and are otherwise negative except as noted above.    Blood pressure 118/78, pulse 60, height 6' (1.829 m), weight 243 lb (110.2 kg).  General appearance: alert and no distress Neck: no adenopathy, no carotid bruit, no JVD, supple, symmetrical, trachea midline and  thyroid not enlarged, symmetric, no tenderness/mass/nodules Lungs: clear to auscultation bilaterally Heart: regular rate and rhythm, S1, S2 normal, no murmur, click, rub or gallop Extremities: extremities normal, atraumatic, no cyanosis or edema  EKG Sinus rhythm at 60 with septal Q waves. I personally reviewed this EKG.  ASSESSMENT AND PLAN:   CAD (coronary artery disease) History of CAD status post cardiac catheterization 10/10/16 revealing a mid LAD CTO which I was unable to cross successfully.He is on Ativan to medications but continues to have dyspnea on exertion and occasional exertional chest pain although he has not taken a sublingual nitroglycerin .I'm going to begin him on a low-dose long-acting oral nitrate however for him to Dr. [place] for consideration of CTO intervention.      Harry GessJonathan J. Berry MD FACP,FACC,FAHA, [facility] 02/22/2017 9:21 AM

## 2017-02-22 NOTE — Assessment & Plan Note (Signed)
History of CAD status post cardiac catheterization 10/10/16 revealing a mid LAD CTO which I was unable to cross successfully.He is on Ativan to medications but continues to have dyspnea on exertion and occasional exertional chest pain although he has not taken a sublingual nitroglycerin .I'm going to begin him on a low-dose long-acting oral nitrate however for him to Dr. [place] for consideration of CTO intervention.

## 2017-02-22 NOTE — Patient Instructions (Signed)
Medication Instructions: START Isosorbide 30mg --take 15 mg (1/2 tablet) by mouth daily.  If you need a refill on your cardiac medications before your next appointment, please call your pharmacy.   Follow-Up:  Schedule an appointment with Dr. [place]andJordan in 2-3 weeks for evaluation of LAD CTO Intervention.  Your physician recommends that you schedule a follow-up appointment in: 3 months with Dr. Allyson SabalBerry.

## 2017-02-24 ENCOUNTER — Ambulatory Visit (HOSPITAL_COMMUNITY): Payer: Managed Care, Other (non HMO)

## 2017-02-26 NOTE — Progress Notes (Signed)
Cardiology Office Note   Date:  02/28/2017   ID:  Harry LovelyChristopher Direnzo, DOB 02/23/1976, MRN [id number]  PCP:  Zenaida DeedMills, Pearce Michael, DO  Cardiologist:  Nanetta BattyJonathan Berry MD  Chief Complaint  Patient presents with  . Follow-up    Eval LAD  . Shortness of Breath  . Coronary Artery Disease      History of Present Illness: Harry Cervantes is a 41 y.o. male who presents for is seen at the request of Dr. Allyson SabalBerry for consideration of CTO PCI of the LAD. He was seen initially by Dr. Allyson SabalBerry in February this year with a history of exertional angina and palpitations. Holter showed PVCs. Myoview study was high risk with significant anterior and apical ischemia. This led to a cardiac cath that showed occlusion of the mid LAD. PCI was attempted but unsuccessful due the wire going subintimal. Medical therapy has been adjusted but the patient has continued to complain of exertional angina and dyspnea despite optimal medical therapy. He feels that this has been a significant negative impact on his lifestyle since he cannot lift weights, run, or even play with his son without getting pain.  He does have a history of prior tobacco use, HTN, and family history of CAD.   Past Medical History:  Diagnosis Date  . CAD (coronary artery disease)    a. 09/2016: abn stress test, cath revealed 99% mLAD, 90% mid-dist LAD, normal LV function (55-65%). Unsuccessful attempt at mid LAD intervention because of severity of disease and inability to cross the lesion successfully. Plan is for Rx therapy  . Hypertension     Past Surgical History:  Procedure Laterality Date  . LEFT HEART CATH AND CORONARY ANGIOGRAPHY N/A 10/10/2016   Procedure: Left Heart Cath and Coronary Angiography;  Surgeon: Runell GessJonathan J Berry, MD;  [facility];  Service: Cardiovascular;  Laterality: N/A;     Current Outpatient Prescriptions  Medication Sig Dispense Refill  . amLODipine (NORVASC) 5 MG tablet Take 1 tablet (5 mg total)  by mouth daily. 5 tablet 3  . aspirin EC 81 MG tablet Take 81 mg by mouth daily.    . Cholecalciferol (VITAMIN D3) 2000 units TABS Take 2,000 Units by mouth daily.    . clopidogrel (PLAVIX) 75 MG tablet Take 75 mg by mouth daily.  1  . isosorbide mononitrate (IMDUR) 30 MG 24 hr tablet Take 0.5 tablets (15 mg total) by mouth daily. 45 tablet 3  . lisinopril (PRINIVIL,ZESTRIL) 10 MG tablet Take 10 mg by mouth daily.    . metoprolol tartrate (LOPRESSOR) 25 MG tablet Take 0.5 tablets (12.5 mg total) by mouth 2 (two) times daily. 60 tablet 3  . nitroGLYCERIN (NITROSTAT) 0.4 MG SL tablet Place 0.4 mg under the tongue every 5 (five) minutes as needed for chest pain.    . rosuvastatin (CRESTOR) 20 MG tablet Take 1 tablet (20 mg total) by mouth daily. 30 tablet 5   No current facility-administered medications for this visit.     Allergies:   Patient has no known allergies.    Social History:  The patient  reports that he quit smoking about 12 months ago. His smokeless tobacco use includes Snuff. He reports that he does not drink alcohol.   Family History:  The patient's family history includes COPD in his father; Congestive Heart Failure in his father; Heart disease in his father; Hypertension in his father and mother.    ROS:  Please see the history of present illness.  Otherwise, review of systems are positive for none.   All other systems are reviewed and negative.    PHYSICAL EXAM: VS:  BP 122/80   Pulse 78   Ht 6' (1.829 m)   Wt 244 lb (110.7 kg)   BMI 33.09 kg/m  , BMI Body mass index is 33.09 kg/m. GEN: Well nourished, well developed, in no acute distress  HEENT: normal  Neck: no JVD, carotid bruits, or masses Cardiac: RRR; no murmurs, rubs, or gallops,no edema. Normal S1-2. All pulses 2+ and equal. No femoral bruits.  Respiratory:  clear to auscultation bilaterally, normal work of breathing GI: soft, nontender, nondistended, + BS MS: no deformity or atrophy  Skin: warm and dry,  no rash Neuro:  Strength and sensation are intact Psych: euthymic mood, full affect   EKG:  EKG is not ordered today. The ekg ordered today demonstrates N/A   Recent Labs: 10/05/2016: Magnesium 1.9; TSH 8.91 10/11/2016: Hemoglobin 12.5; Platelets 211 12/01/2016: ALT 19; BUN 10; Creat 0.97; Potassium 4.4; Sodium 141    Lipid Panel    Component Value Date/Time   CHOL 119 12/01/2016 1107   TRIG 50 12/01/2016 1107   HDL 38 (L) 12/01/2016 1107   CHOLHDL 3.1 12/01/2016 1107   VLDL 10 12/01/2016 1107   LDLCALC 71 12/01/2016 1107      Wt Readings from Last 3 Encounters:  02/28/17 244 lb (110.7 kg)  02/22/17 243 lb (110.2 kg)  01/13/17 242 lb (109.8 kg)      Other studies Reviewed: Additional studies/ records that were reviewed today include:  Myoview 10/06/16: Study Highlights     The left ventricular ejection fraction is moderately decreased (30-44%).  Nuclear stress EF: 43%.  Horizontal ST segment depression ST segment depression of 1 mm was noted during stress in the V6, V5, V4, aVF, III and II leads, beginning at 10 minutes of stress, and returning to baseline after 1-5 minutes of recovery.  Defect 1: There is a defect present in the basal anteroseptal, mid anterior, mid anteroseptal, apical anterior, apical septal and apex location.  Findings consistent with ischemia.  This is a high risk study.   High risk study consistent with extensive ischemia due to proximal LAD artery stenosis. Mild to moderate reduction in LV systolic function, likely due to ischemic/stunned (but viable) myocardium.    Holter monitor 10/06/16: Study Highlights   1. SR with occassional PVCs   Echo 10/10/16 Study Conclusions  - Left ventricle: The cavity size was normal. Systolic function was   normal. The estimated ejection fraction was in the range of 55%   to 60%. Wall motion was normal; there were no regional wall   motion abnormalities. The transmitral flow pattern was normal.   Left  ventricular diastolic function parameters were normal. - Aortic valve: Trileaflet; mildly thickened, mildly calcified   leaflets. - Mitral valve: There was trivial regurgitation. - Pulmonary arteries: Systolic pressure could not be accurately   estimated.  Cardiac cath 10/10/16: Procedures   Left Heart Cath and Coronary Angiography  Conclusion     Mid LAD lesion, 99 %stenosed.  Mid LAD to Dist LAD lesion, 90 %stenosed.  The left ventricular systolic function is normal.  LV end diastolic pressure is normal.  The left ventricular ejection fraction is 55-65% by visual estimate.   Harry LovelyChristopher Mcevers is a 41 y.o. male    [id number] LOCATION:  FACILITY: [facility]  PHYSICIAN: Nanetta BattyJonathan Berry, M.D. 12/30/1975   DATE OF PROCEDURE:  10/10/2016  DATE OF DISCHARGE:  CARDIAC CATHETERIZATION / PCI    History obtained from chart review. Mr. Forbush is a delightful 41 year old demented appearing married Caucasian male father of 5 children which accompany by his wife Delice Bison today. He was [facility] by [name] as a new patient on 10/05/16. He was seen in the emergency room several days before for palpitations and chest pain. This was factors for heart disease include treated hypertension, discontinued tobacco abuse and family history. Father did have a myocardial infarction and stent. 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 His Myoview stress test performed yesterday revealed positive GXT with ischemia in the LAD territory and an ejection fraction of 43%. He presents today for angiography and potential intervention.   PROCEDURE DESCRIPTION:   The patient was brought to the second floor [facility]iac cath lab in the postabsorptive state. He was premedicated with Valium 5 mg by mouth, IV Versed and fentanyl. His right groinwas prepped and shaved in usual sterile fashion. Xylocaine 1% was used for local anesthesia. A 6 French sheath was inserted into the right common  femoral artery using standard Seldinger technique. A 5 French right and left Judkins diagnostic catheters lung with a 5 French pigtail catheter were used for selective coronary angiography and left ventriculography respectively. Isovue dye was used for the entirety of the case. Retrograde aortic, left ventricular and pullback pressures were recorded.  The patient received 600 mg of Plavix by mouth as well as Angiomax bolus followed by infusion. His ACT was demonstrated to be therapeutic. He had a tight mid LAD lesion after a small to medium-sized first diagonal branch with faint right-to-left collaterals. Using an XB LAD 3.5 cm guide catheter along with an 014 Pro water guidewire which was placed in the diagonal branch and a Fielder XT guidewire which was used to attempt to cross the mid LAD lesion. I did balloon the lesion with a 2 mm balloon just beyond the first diagonal branch. I was never able to reliably get back into the true lumen with a Fielder wire. I did ask Dr. Clifton James to assist briefly. We extended the [facility] wire and used a 1.5 mm over-the-wire balloon to get beyond the diagonal branch and inject through the balloon to assess position. There was obvious staining which was probably intramural. It was then decided to abandon the procedure since the vessel was relatively small and the patient remained hemodynamic stable throughout the case. A bedside echo revealed normal LV function without evidence of pericardial effusion. The Angiomax was discontinued.  IMPRESSION: Unsuccessful attempt at mid LAD intervention because of severity of disease and inability to cross the lesion successfully. The patient has remained hemodynamically and electrocardiographically stable throughout the procedure. Bedside echo revealed his LV to be normal without effusion. Plan will be for medical therapy. We'll get a complete 2-D echo for LV function and continue aspirin at this time. We will add low-dose beta blocker as  well. The patient left the lab in stable condition.  Nanetta Batty. MD, [facility] 10/10/2016 11:08 AM      Indications   Coronary artery disease due to lipid rich plaque [I25.10, I25.83 (ICD-10-CM)]  Abnormal nuclear stress test [R94.39 (ICD-10-CM)]  Procedural Details/Technique   Technical Details Estimated blood loss <50 mL.  During this procedure the patient was administered the following to achieve and maintain moderate conscious sedation: Versed 2 mg, Fentanyl 100 mcg, while the patient's heart rate, blood pressure, and oxygen saturation were continuously monitored. The period of conscious sedation was 94  minutes, of which I was present face-to-face 100% of this time.    Coronary Findings   Dominance: Right  Left Anterior Descending  Dist LAD filled by collaterals from Inf Sept.  Mid LAD lesion, 99% stenosed.  Mid LAD to Dist LAD lesion, 90% stenosed.  Wall Motion              Left Heart   Left Ventricle The left ventricular size is normal. The left ventricular systolic function is normal. LV end diastolic pressure is normal. The left ventricular ejection fraction is 55-65% by visual estimate. No regional wall motion abnormalities.    Coronary Diagrams   Diagnostic Diagram          ASSESSMENT AND PLAN:  1.  CAD with CTO of the mid LAD. Failed primary PCI in February. Continued anginal symptoms despite optimal medical therapy. I reviewed prior angiograms with patient and wife. We discussed options for treatment including increasing medical therapy, CTO PCI, or CABG. He is agreeable to proceed with CTO PCI. The procedure and risks were reviewed including but not limited to death, myocardial infarction, perforation, stroke, arrythmias, bleeding, transfusion, emergency surgery, dye allergy, or renal dysfunction. The patient voices understanding and is agreeable to proceed. Will arrange for next Wednesday July 11.   2. HTN currently controlled.  3.  Hypercholesterolemia. Good control on statin therapy  4. Tobacco use disorder. Quit one year ago. Still dips.   5. Family history of CAD.    Current medicines are reviewed at length with the patient today.  The patient does not have concerns regarding medicines.  The following changes have been made:  no change  Labs/ tests ordered today include:   Orders Placed This Encounter  Procedures  . Basic metabolic panel  . CBC w/Diff/Platelet  . PT and PTT      Signed, Leighanne Adolph [place], MD  02/28/2017 5:11 PM    [facility] [address] [place], [place], [address] Phone [phone], Fax [phone]

## 2017-02-27 ENCOUNTER — Ambulatory Visit (HOSPITAL_COMMUNITY): Payer: Managed Care, Other (non HMO)

## 2017-02-28 ENCOUNTER — Encounter: Payer: Self-pay | Admitting: Cardiology

## 2017-02-28 ENCOUNTER — Other Ambulatory Visit: Payer: Self-pay | Admitting: Cardiology

## 2017-02-28 ENCOUNTER — Ambulatory Visit (INDEPENDENT_AMBULATORY_CARE_PROVIDER_SITE_OTHER): Payer: Managed Care, Other (non HMO) | Admitting: Cardiology

## 2017-02-28 VITALS — BP 122/80 | HR 78 | Ht 72.0 in | Wt 244.0 lb

## 2017-02-28 DIAGNOSIS — R9439 Abnormal result of other cardiovascular function study: Secondary | ICD-10-CM

## 2017-02-28 DIAGNOSIS — E7800 Pure hypercholesterolemia, unspecified: Secondary | ICD-10-CM | POA: Diagnosis not present

## 2017-02-28 DIAGNOSIS — I209 Angina pectoris, unspecified: Secondary | ICD-10-CM

## 2017-02-28 DIAGNOSIS — I25118 Atherosclerotic heart disease of native coronary artery with other forms of angina pectoris: Secondary | ICD-10-CM

## 2017-02-28 DIAGNOSIS — I10 Essential (primary) hypertension: Secondary | ICD-10-CM | POA: Diagnosis not present

## 2017-02-28 DIAGNOSIS — Z01812 Encounter for preprocedural laboratory examination: Secondary | ICD-10-CM | POA: Diagnosis not present

## 2017-03-01 ENCOUNTER — Ambulatory Visit (HOSPITAL_COMMUNITY): Payer: Managed Care, Other (non HMO)

## 2017-03-01 LAB — CBC WITH DIFFERENTIAL/PLATELET
BASOS: 0 %
Basophils Absolute: 0 10*3/uL (ref 0.0–0.2)
EOS (ABSOLUTE): 0.2 10*3/uL (ref 0.0–0.4)
EOS: 3 %
HEMATOCRIT: 39.4 % (ref 37.5–51.0)
HEMOGLOBIN: 12.6 g/dL — AB (ref 13.0–17.7)
IMMATURE GRANULOCYTES: 0 %
Immature Grans (Abs): 0 10*3/uL (ref 0.0–0.1)
LYMPHS ABS: 1.6 10*3/uL (ref 0.7–3.1)
Lymphs: 23 %
MCH: 28.8 pg (ref 26.6–33.0)
MCHC: 32 g/dL (ref 31.5–35.7)
MCV: 90 fL (ref 79–97)
MONOCYTES: 9 %
Monocytes Absolute: 0.6 10*3/uL (ref 0.1–0.9)
Neutrophils Absolute: 4.6 10*3/uL (ref 1.4–7.0)
Neutrophils: 65 %
Platelets: 207 10*3/uL (ref 150–379)
RBC: 4.38 x10E6/uL (ref 4.14–5.80)
RDW: 13.7 % (ref 12.3–15.4)
WBC: 7 10*3/uL (ref 3.4–10.8)

## 2017-03-01 LAB — BASIC METABOLIC PANEL
BUN / CREAT RATIO: 13 (ref 9–20)
BUN: 14 mg/dL (ref 6–24)
CO2: 27 mmol/L (ref 20–29)
CREATININE: 1.08 mg/dL (ref 0.76–1.27)
Calcium: 8.6 mg/dL — ABNORMAL LOW (ref 8.7–10.2)
Chloride: 105 mmol/L (ref 96–106)
GFR calc Af Amer: 97 mL/min/{1.73_m2} (ref >59)
GFR, EST NON AFRICAN AMERICAN: 84 mL/min/{1.73_m2} (ref >59)
GLUCOSE: 86 mg/dL (ref 65–99)
Potassium: 3.9 mmol/L (ref 3.5–5.2)
SODIUM: 142 mmol/L (ref 134–144)

## 2017-03-01 LAB — PT AND PTT
APTT: 25 s (ref 24–33)
INR: 1 (ref 0.8–1.2)
PROTHROMBIN TIME: 10.4 s (ref 9.1–12.0)

## 2017-03-03 ENCOUNTER — Ambulatory Visit (HOSPITAL_COMMUNITY): Payer: Managed Care, Other (non HMO)

## 2017-03-06 ENCOUNTER — Ambulatory Visit (HOSPITAL_COMMUNITY): Payer: Managed Care, Other (non HMO)

## 2017-03-08 ENCOUNTER — Ambulatory Visit (HOSPITAL_COMMUNITY): Payer: Managed Care, Other (non HMO)

## 2017-03-08 ENCOUNTER — Ambulatory Visit (HOSPITAL_COMMUNITY)
Admission: RE | Disposition: A | Discharge status: Home / Self Care | Payer: Self-pay | Source: Ambulatory Visit | Attending: Cardiology

## 2017-03-08 ENCOUNTER — Encounter (HOSPITAL_COMMUNITY): Payer: Self-pay | Admitting: General Practice

## 2017-03-08 ENCOUNTER — Ambulatory Visit (HOSPITAL_COMMUNITY)
Admission: RE | Admit: 2017-03-08 | Discharge: 2017-03-09 | Disposition: A | Discharge status: Home / Self Care | Payer: Managed Care, Other (non HMO) | Source: Ambulatory Visit | Attending: Cardiology | Admitting: Cardiology

## 2017-03-08 DIAGNOSIS — Z955 Presence of coronary angioplasty implant and graft: Secondary | ICD-10-CM

## 2017-03-08 DIAGNOSIS — M549 Dorsalgia, unspecified: Secondary | ICD-10-CM

## 2017-03-08 DIAGNOSIS — E7800 Pure hypercholesterolemia, unspecified: Secondary | ICD-10-CM | POA: Insufficient documentation

## 2017-03-08 DIAGNOSIS — I493 Ventricular premature depolarization: Secondary | ICD-10-CM | POA: Diagnosis not present

## 2017-03-08 DIAGNOSIS — I10 Essential (primary) hypertension: Secondary | ICD-10-CM | POA: Diagnosis not present

## 2017-03-08 DIAGNOSIS — I209 Angina pectoris, unspecified: Secondary | ICD-10-CM | POA: Diagnosis present

## 2017-03-08 DIAGNOSIS — I25119 Atherosclerotic heart disease of native coronary artery with unspecified angina pectoris: Secondary | ICD-10-CM | POA: Diagnosis not present

## 2017-03-08 DIAGNOSIS — I2510 Atherosclerotic heart disease of native coronary artery without angina pectoris: Secondary | ICD-10-CM

## 2017-03-08 DIAGNOSIS — Z7982 Long term (current) use of aspirin: Secondary | ICD-10-CM | POA: Insufficient documentation

## 2017-03-08 DIAGNOSIS — S301XXA Contusion of abdominal wall, initial encounter: Secondary | ICD-10-CM

## 2017-03-08 DIAGNOSIS — Y840 Cardiac catheterization as the cause of abnormal reaction of the patient, or of later complication, without mention of misadventure at the time of the procedure: Secondary | ICD-10-CM | POA: Insufficient documentation

## 2017-03-08 DIAGNOSIS — Z720 Tobacco use: Secondary | ICD-10-CM | POA: Insufficient documentation

## 2017-03-08 DIAGNOSIS — Z8249 Family history of ischemic heart disease and other diseases of the circulatory system: Secondary | ICD-10-CM | POA: Insufficient documentation

## 2017-03-08 DIAGNOSIS — E785 Hyperlipidemia, unspecified: Secondary | ICD-10-CM | POA: Diagnosis present

## 2017-03-08 DIAGNOSIS — I97630 Postprocedural hematoma of a circulatory system organ or structure following a cardiac catheterization: Secondary | ICD-10-CM | POA: Diagnosis not present

## 2017-03-08 DIAGNOSIS — R9439 Abnormal result of other cardiovascular function study: Secondary | ICD-10-CM | POA: Diagnosis present

## 2017-03-08 DIAGNOSIS — I2582 Chronic total occlusion of coronary artery: Secondary | ICD-10-CM

## 2017-03-08 DIAGNOSIS — Z7902 Long term (current) use of antithrombotics/antiplatelets: Secondary | ICD-10-CM | POA: Diagnosis not present

## 2017-03-08 HISTORY — DX: Hypoglycemia, unspecified: E16.2

## 2017-03-08 HISTORY — PX: CORONARY ANGIOGRAPHY: CATH118303

## 2017-03-08 HISTORY — DX: Presence of coronary angioplasty implant and graft: Z95.5

## 2017-03-08 HISTORY — DX: Atherosclerotic heart disease of native coronary artery with unspecified angina pectoris: I25.119

## 2017-03-08 HISTORY — DX: Pure hypercholesterolemia, unspecified: E78.00

## 2017-03-08 HISTORY — DX: Anemia, unspecified: D64.9

## 2017-03-08 HISTORY — PX: CORONARY CTO INTERVENTION: CATH118236

## 2017-03-08 LAB — CBC
HEMATOCRIT: 39.3 % (ref 39.0–52.0)
HEMOGLOBIN: 13.1 g/dL (ref 13.0–17.0)
MCH: 29.2 pg (ref 26.0–34.0)
MCHC: 33.3 g/dL (ref 30.0–36.0)
MCV: 87.5 fL (ref 78.0–100.0)
Platelets: 220 10*3/uL (ref 150–400)
RBC: 4.49 MIL/uL (ref 4.22–5.81)
RDW: 13.5 % (ref 11.5–15.5)
WBC: 10.6 10*3/uL — AB (ref 4.0–10.5)

## 2017-03-08 LAB — POCT ACTIVATED CLOTTING TIME
ACTIVATED CLOTTING TIME: 224 s
ACTIVATED CLOTTING TIME: 230 s
Activated Clotting Time: 263 seconds
Activated Clotting Time: 290 seconds

## 2017-03-08 SURGERY — CORONARY CTO INTERVENTION
Anesthesia: LOCAL

## 2017-03-08 MED ORDER — LISINOPRIL 10 MG PO TABS
10.0000 mg | ORAL_TABLET | Freq: Every day | ORAL | Status: DC
  Administered 2017-03-09: 10 mg via ORAL
  Filled 2017-03-08: qty 1

## 2017-03-08 MED ORDER — SODIUM CHLORIDE 0.9 % IV SOLN
250.0000 mL | INTRAVENOUS | Status: DC | PRN
Start: 2017-03-08 — End: 2017-03-08

## 2017-03-08 MED ORDER — NITROGLYCERIN 1 MG/10 ML FOR IR/CATH LAB
INTRA_ARTERIAL | Status: DC | PRN
  Administered 2017-03-08 (×3): 200 ug via INTRACORONARY

## 2017-03-08 MED ORDER — SODIUM CHLORIDE 0.9 % WEIGHT BASED INFUSION
1.0000 mL/kg/h | INTRAVENOUS | Status: AC
  Administered 2017-03-08: 1 mL/kg/h via INTRAVENOUS

## 2017-03-08 MED ORDER — SODIUM CHLORIDE 0.9% FLUSH: 3.0000 mL | Freq: Two times a day (BID) | INTRAVENOUS | Status: DC

## 2017-03-08 MED ORDER — SODIUM CHLORIDE 0.9% FLUSH: 3.0000 mL | INTRAVENOUS | Status: DC | PRN

## 2017-03-08 MED ORDER — DIAZEPAM 5 MG PO TABS
10.0000 mg | ORAL_TABLET | Freq: Once | ORAL | Status: AC
  Administered 2017-03-08: 10 mg via ORAL

## 2017-03-08 MED ORDER — ROSUVASTATIN CALCIUM 20 MG PO TABS
20.0000 mg | ORAL_TABLET | Freq: Every day | ORAL | Status: DC
  Administered 2017-03-08: 22:00:00 20 mg via ORAL
  Filled 2017-03-08: qty 1

## 2017-03-08 MED ORDER — SODIUM CHLORIDE 0.9 % IV SOLN: 250.0000 mL | INTRAVENOUS | Status: DC | PRN

## 2017-03-08 MED ORDER — LIDOCAINE HCL (PF) 1 % IJ SOLN
INTRAMUSCULAR | Status: DC | PRN
  Administered 2017-03-08 (×2): 15 mL

## 2017-03-08 MED ORDER — DIAZEPAM 5 MG PO TABS
ORAL_TABLET | ORAL | Status: AC
  Administered 2017-03-08: 10 mg via ORAL
  Filled 2017-03-08: qty 2

## 2017-03-08 MED ORDER — SODIUM CHLORIDE 0.9 % WEIGHT BASED INFUSION
3.0000 mL/kg/h | INTRAVENOUS | Status: DC
  Administered 2017-03-08: 3 mL/kg/h via INTRAVENOUS

## 2017-03-08 MED ORDER — ASPIRIN EC 81 MG PO TBEC
81.0000 mg | DELAYED_RELEASE_TABLET | Freq: Every day | ORAL | Status: DC
  Administered 2017-03-09: 81 mg via ORAL
  Filled 2017-03-08: qty 1

## 2017-03-08 MED ORDER — FENTANYL CITRATE (PF) 100 MCG/2ML IJ SOLN
50.0000 ug | Freq: Once | INTRAMUSCULAR | Status: AC
  Administered 2017-03-08: 50 ug via INTRAVENOUS

## 2017-03-08 MED ORDER — MIDAZOLAM HCL 2 MG/2ML IJ SOLN
INTRAMUSCULAR | Status: DC | PRN
  Administered 2017-03-08: 1 mg via INTRAVENOUS

## 2017-03-08 MED ORDER — ASPIRIN 81 MG PO CHEW: 81.0000 mg | CHEWABLE_TABLET | ORAL | Status: DC

## 2017-03-08 MED ORDER — NITROGLYCERIN 0.4 MG SL SUBL: 0.4000 mg | SUBLINGUAL_TABLET | SUBLINGUAL | Status: DC | PRN

## 2017-03-08 MED ORDER — HEPARIN SODIUM (PORCINE) 1000 UNIT/ML IJ SOLN
INTRAMUSCULAR | Status: AC
  Filled 2017-03-08: qty 1

## 2017-03-08 MED ORDER — FENTANYL CITRATE (PF) 100 MCG/2ML IJ SOLN
INTRAMUSCULAR | Status: AC
  Filled 2017-03-08: qty 2

## 2017-03-08 MED ORDER — IOPAMIDOL (ISOVUE-370) INJECTION 76%
INTRAVENOUS | Status: AC
  Filled 2017-03-08: qty 100

## 2017-03-08 MED ORDER — HEPARIN (PORCINE) IN NACL 2-0.9 UNIT/ML-% IJ SOLN
INTRAMUSCULAR | Status: AC
  Filled 2017-03-08: qty 500

## 2017-03-08 MED ORDER — MIDAZOLAM HCL 2 MG/2ML IJ SOLN
INTRAMUSCULAR | Status: AC
  Filled 2017-03-08: qty 2

## 2017-03-08 MED ORDER — CLOPIDOGREL BISULFATE 75 MG PO TABS
75.0000 mg | ORAL_TABLET | Freq: Every day | ORAL | Status: DC
  Administered 2017-03-09: 75 mg via ORAL
  Filled 2017-03-08: qty 1

## 2017-03-08 MED ORDER — IOPAMIDOL (ISOVUE-300) INJECTION 61%
INTRAVENOUS | Status: AC
  Administered 2017-03-08: 21:00:00 100 mL
  Filled 2017-03-08: qty 100

## 2017-03-08 MED ORDER — SODIUM CHLORIDE 0.9 % WEIGHT BASED INFUSION: 1.0000 mL/kg/h | INTRAVENOUS | Status: DC

## 2017-03-08 MED ORDER — LIDOCAINE HCL (PF) 1 % IJ SOLN
INTRAMUSCULAR | Status: AC
Start: 2017-03-08 — End: 2017-03-08
  Filled 2017-03-08: qty 30

## 2017-03-08 MED ORDER — SODIUM CHLORIDE 0.9% FLUSH
3.0000 mL | Freq: Two times a day (BID) | INTRAVENOUS | Status: DC
  Administered 2017-03-08 – 2017-03-09 (×2): 3 mL via INTRAVENOUS

## 2017-03-08 MED ORDER — AMLODIPINE BESYLATE 5 MG PO TABS
5.0000 mg | ORAL_TABLET | Freq: Every day | ORAL | Status: DC
  Administered 2017-03-09: 5 mg via ORAL
  Filled 2017-03-08: qty 1

## 2017-03-08 MED ORDER — ONDANSETRON HCL 4 MG/2ML IJ SOLN: 4.0000 mg | Freq: Four times a day (QID) | INTRAMUSCULAR | Status: DC | PRN

## 2017-03-08 MED ORDER — FENTANYL CITRATE (PF) 100 MCG/2ML IJ SOLN: 50.0000 ug | Freq: Once | INTRAMUSCULAR | Status: DC

## 2017-03-08 MED ORDER — FENTANYL CITRATE (PF) 100 MCG/2ML IJ SOLN
INTRAMUSCULAR | Status: AC
  Administered 2017-03-08: 50 ug
  Filled 2017-03-08: qty 2

## 2017-03-08 MED ORDER — IOPAMIDOL (ISOVUE-370) INJECTION 76%
INTRAVENOUS | Status: DC | PRN
  Administered 2017-03-08: 170 mL via INTRAVENOUS

## 2017-03-08 MED ORDER — ACETAMINOPHEN-CODEINE #3 300-30 MG PO TABS
1.0000 | ORAL_TABLET | ORAL | Status: DC | PRN
  Administered 2017-03-08 – 2017-03-09 (×2): 1 via ORAL
  Filled 2017-03-08 (×2): qty 1

## 2017-03-08 MED ORDER — METOPROLOL TARTRATE 12.5 MG HALF TABLET
12.5000 mg | ORAL_TABLET | Freq: Two times a day (BID) | ORAL | Status: DC
  Administered 2017-03-08 – 2017-03-09 (×2): 12.5 mg via ORAL
  Filled 2017-03-08 (×2): qty 1

## 2017-03-08 MED ORDER — FENTANYL CITRATE (PF) 100 MCG/2ML IJ SOLN
INTRAMUSCULAR | Status: DC | PRN
  Administered 2017-03-08: 50 ug via INTRAVENOUS
  Administered 2017-03-08: 25 ug via INTRAVENOUS

## 2017-03-08 MED ORDER — IOPAMIDOL (ISOVUE-370) INJECTION 76%
INTRAVENOUS | Status: AC
  Filled 2017-03-08: qty 125

## 2017-03-08 MED ORDER — HEPARIN SODIUM (PORCINE) 1000 UNIT/ML IJ SOLN
INTRAMUSCULAR | Status: DC | PRN
  Administered 2017-03-08: 4000 [IU] via INTRAVENOUS
  Administered 2017-03-08: 3000 [IU] via INTRAVENOUS
  Administered 2017-03-08: 9000 [IU] via INTRAVENOUS

## 2017-03-08 MED ORDER — FENTANYL CITRATE (PF) 100 MCG/2ML IJ SOLN: 50.0000 ug | INTRAMUSCULAR | Status: DC | PRN

## 2017-03-08 MED ORDER — ANGIOPLASTY BOOK
Freq: Once | Status: DC
  Filled 2017-03-08: qty 1

## 2017-03-08 MED ORDER — NITROGLYCERIN 1 MG/10 ML FOR IR/CATH LAB
INTRA_ARTERIAL | Status: AC
  Filled 2017-03-08: qty 10

## 2017-03-08 MED ORDER — VITAMIN D 1000 UNITS PO TABS
2000.0000 [IU] | ORAL_TABLET | Freq: Every day | ORAL | Status: DC
  Administered 2017-03-09: 09:00:00 2000 [IU] via ORAL
  Filled 2017-03-08: qty 2

## 2017-03-08 MED ORDER — ACETAMINOPHEN 325 MG PO TABS: 650.0000 mg | ORAL_TABLET | ORAL | Status: DC | PRN

## 2017-03-08 MED ORDER — ATROPINE SULFATE 1 MG/10ML IJ SOSY
PREFILLED_SYRINGE | INTRAMUSCULAR | Status: AC
  Filled 2017-03-08: qty 10

## 2017-03-08 MED ORDER — HEPARIN (PORCINE) IN NACL 2-0.9 UNIT/ML-% IJ SOLN
INTRAMUSCULAR | Status: AC | PRN
Start: 2017-03-08 — End: 2017-03-08
  Administered 2017-03-08: 500 mL
  Administered 2017-03-08: 1000 mL

## 2017-03-08 SURGICAL SUPPLY — 20 items
BALLN EMERGE MR 2.5X15 (BALLOONS) ×2
BALLN ~~LOC~~ EMERGE MR 3.5X15 (BALLOONS) ×2
BALLOON EMERGE MR 2.5X15 (BALLOONS) ×1 IMPLANT
BALLOON ~~LOC~~ EMERGE MR 3.5X15 (BALLOONS) ×1 IMPLANT
CATH EXPO 5FR FR4 (CATHETERS) ×2 IMPLANT
CATH MACH 1 7FR CLS3.5 (CATHETERS) ×2 IMPLANT
CATH TRAPPER 6-8F (CATHETERS) ×2 IMPLANT
CATH TURNPIKE 135CM (CATHETERS) ×2 IMPLANT
ELECT DEFIB PAD ADLT CADENCE (PAD) ×2 IMPLANT
KIT ENCORE 26 ADVANTAGE (KITS) ×2 IMPLANT
KIT HEART LEFT (KITS) ×4 IMPLANT
PACK CARDIAC CATHETERIZATION (CUSTOM PROCEDURE TRAY) ×2 IMPLANT
SHEATH BRITE TIP 8FR 35CM (SHEATH) ×2 IMPLANT
SHEATH PINNACLE 6F 10CM (SHEATH) ×2 IMPLANT
STENT SYNERGY DES 3X38 (Permanent Stent) ×2 IMPLANT
TRANSDUCER W/STOPCOCK (MISCELLANEOUS) ×4 IMPLANT
TUBING CIL FLEX 10 FLL-RA (TUBING) ×4 IMPLANT
VALVE GUARDIAN II ~~LOC~~ HEMO (MISCELLANEOUS) ×2 IMPLANT
WIRE EMERALD 3MM-J .035X150CM (WIRE) ×2 IMPLANT
WIRE FIGHTER CROSSING 190CM (WIRE) ×2 IMPLANT

## 2017-03-08 NOTE — Discharge Summary (Signed)
Discharge Summary    Patient ID: Harry Cervantes,  MRN: [id number], DOB/AGE: 42-Sep-1977 42 y.o.  Admit date: 03/08/2017 Discharge date: 03/09/2017  Primary Care Provider: Zenaida Deed Primary Cardiologist: Allyson Sabal    Discharge Diagnoses    Principal Problem:   Coronary artery disease involving native coronary artery of native heart with angina pectoris [facility]) Active Problems:   Angina pectoris (HCC)   Abnormal stress test   Hypertension   Hyperlipidemia   Presence of drug coated stent in LAD coronary artery   Allergies No Known Allergies  Diagnostic Studies/Procedures    LHC: 03/08/17  Conclusion     Mid LAD lesion, 99 %stenosed.  A STENT SYNERGY DES [id number] drug eluting stent was successfully placed.  Post intervention, there is a 0% residual stenosis.   1. Successful CTO PCI of the mid LAD with DES  Plan: DAPT for one year. Anticipate DC in am.  _____________   History of Present Illness     41 y.o. male who was seen by Dr. [place]il[place] at the request of Dr. Allyson Sabal for consideration of CTO PCI of the LAD. He was seen initially by Dr. Allyson Sabal in February this year with a history of exertional angina and palpitations. Holter showed PVCs. Myoview study was high risk with significant anterior and apical ischemia. This led to a cardiac cath that showed occlusion of the mid LAD. PCI was attempted but unsuccessful due the wire going subintimal. Medical therapy has been adjusted but the patient has continued to complain of exertional angina and dyspnea despite optimal medical therapy. He felt that this has been a significant negative impact on his lifestyle since he cannot lift weights, run, or even play with his son without getting pain.  He does have a history of prior tobacco use, HTN, and family history of CAD. Options were discussed at this appt and patient elected to proceed with CTO.   Hospital Course     Underwent successful CTO PCI of the mLAD with DES  x1. Plan for DAPT with ASA/plavix. Did develop a left groin hematoma post cath. Pressure held post sheath pull. Also had back pain. CBC was stable. CT abd/pelvis showed no RP bleed, with mild left groin hematoma (results not in Epic but discussed with Dr. Herbie Baltimore). Felt well post cath, no further chest pain. Post cath labs showed Cr 1.04 and Hgb 12.2. Able to ambulate with cardiac rehab without difficulty the following morning.   He was seen by Dr. Herbie Baltimore and determined stable for discharge home. Follow up in the office has been arranged. Medications are listed below.  _____________  Discharge Vitals Blood pressure 131/83, pulse 73, temperature (!) 97.4 F (36.3 C), temperature source Oral, resp. rate 16, height 6' (1.829 m), weight 238 lb 1.6 oz (108 kg), SpO2 94 %.  Filed Weights   03/08/17 0932 03/09/17 0320  Weight: 240 lb (108.9 kg) 238 lb 1.6 oz (108 kg)    Labs & Radiologic Studies    CBC  Recent Labs  03/08/17 1619 03/09/17 0313  WBC 10.6* 8.0  HGB 13.1 12.2*  HCT 39.3 37.7*  MCV 87.5 90.0  PLT 220 198   Basic Metabolic Panel  Recent Labs  03/09/17 0313  NA 136  K 4.5  CL 105  CO2 24  GLUCOSE 125*  BUN 12  CREATININE 1.04  CALCIUM 8.1*   Liver Function Tests No results for input(s): AST, ALT, ALKPHOS, BILITOT, PROT, ALBUMIN in the last 72 hours. No results  for input(s): LIPASE, AMYLASE in the last 72 hours. Cardiac Enzymes No results for input(s): CKTOTAL, CKMB, CKMBINDEX, TROPONINI in the last 72 hours. BNP Invalid input(s): POCBNP D-Dimer No results for input(s): DDIMER in the last 72 hours. Hemoglobin A1C No results for input(s): HGBA1C in the last 72 hours. Fasting Lipid Panel No results for input(s): CHOL, HDL, LDLCALC, TRIG, CHOLHDL, LDLDIRECT in the last 72 hours. Thyroid Function Tests No results for input(s): TSH, T4TOTAL, T3FREE, THYROIDAB in the last 72 hours.  Invalid input(s): FREET3 _____________  Ct Abdomen Pelvis W Contrast  Result  Date: 03/09/2017 CLINICAL DATA:  Left groin hematoma after catheterization EXAM: CT ABDOMEN AND PELVIS WITH CONTRAST TECHNIQUE: Multidetector CT imaging of the abdomen and pelvis was performed using the standard protocol following bolus administration of intravenous contrast. CONTRAST:  ISOVUE-300 IOPAMIDOL (ISOVUE-300) INJECTION 61% COMPARISON:  None. FINDINGS: Lower chest:  bibasilar atelectasis.  Heart size.  No effusions. Hepatobiliary: No focal hepatic abnormality. Gallbladder unremarkable. Pancreas: No focal abnormality or ductal dilatation. Spleen: No focal abnormality.  Normal size. Adrenals/Urinary Tract: punctate nonobstructing stone in the lower pole of the right kidney. No hydronephrosis. Small left renal parapelvic cysts. Adrenal glands and urinary bladder unremarkable. Stomach/Bowel: Stomach, large and small bowel grossly unremarkable. Vascular/Lymphatic: No evidence of aneurysm or adenopathy. No evidence of active extravasation in the left groin. Reproductive: No visible focal abnormality. Other: No free fluid or free air. Stranding noted in the left groin soft tissues compatible with small hematoma. No well-defined drainable fluid collection. Musculoskeletal: No acute bony abnormality. IMPRESSION: Small hematoma in the left groin. Bibasilar atelectasis. Right lower pole nephrolithiasis, nonobstructing. Electronically Signed   By: Charlett Nose M.D.   On: 03/09/2017 08:22   Disposition   Pt is being discharged home today in good condition.  Follow-up Plans & Appointments    Follow-up Information    [place], Peter M, MD Follow up on 03/23/2017.   Specialty:  Cardiology Why:  at 3:20pm for your follow up appt.  Contact information: [address] [place] [address] [phone]          Discharge Instructions    Amb Referral to Cardiac Rehabilitation    Complete by:  As directed    Diagnosis:   Coronary Stents PTCA     Call MD for:  redness, tenderness, or  signs of infection (pain, swelling, redness, odor or green/yellow discharge around incision site)    Complete by:  As directed    Diet - low sodium heart healthy    Complete by:  As directed    Discharge instructions    Complete by:  As directed    Groin Site Care Refer to this sheet in the next few weeks. These instructions provide you with information on caring for yourself after your procedure. Your caregiver may also give you more specific instructions. Your treatment has been planned according to current medical practices, but problems sometimes occur. Call your caregiver if you have any problems or questions after your procedure. HOME CARE INSTRUCTIONS You may shower 24 hours after the procedure. Remove the bandage (dressing) and gently wash the site with plain soap and water. Gently pat the site dry.  Do not apply powder or lotion to the site.  Do not sit in a bathtub, swimming pool, or whirlpool for 5 to 7 days.  No bending, squatting, or lifting anything over 10 pounds (4.5 kg) as directed by your caregiver.  Inspect the site at least twice daily.  Do  not drive home if you are discharged the same day of the procedure. Have someone else drive you.  You may drive 24 hours after the procedure unless otherwise instructed by your caregiver.  What to expect: Any bruising will usually fade within 1 to 2 weeks.  Blood that collects in the tissue (hematoma) may be painful to the touch. It should usually decrease in size and tenderness within 1 to 2 weeks.  SEEK IMMEDIATE MEDICAL CARE IF: You have unusual pain at the groin site or down the affected leg.  You have redness, warmth, swelling, or pain at the groin site.  You have drainage (other than a small amount of blood on the dressing).  You have chills.  You have a fever or persistent symptoms for more than 72 hours.  You have a fever and your symptoms suddenly get worse.  Your leg becomes pale, cool, tingly, or numb.  You have heavy  bleeding from the site. Hold pressure on the site. .   Increase activity slowly    Complete by:  As directed       Discharge Medications   Current Discharge Medication List    CONTINUE these medications which have NOT CHANGED   Details  amLODipine (NORVASC) 5 MG tablet Take 1 tablet (5 mg total) by mouth daily. Qty: 5 tablet, Refills: 3    aspirin EC 81 MG tablet Take 81 mg by mouth daily.    Cholecalciferol (VITAMIN D3) 2000 units TABS Take 2,000 Units by mouth daily.    clopidogrel (PLAVIX) 75 MG tablet Take 75 mg by mouth daily. Refills: 1    isosorbide mononitrate (IMDUR) 30 MG 24 hr tablet Take 0.5 tablets (15 mg total) by mouth daily. Qty: 45 tablet, Refills: 3    lisinopril (PRINIVIL,ZESTRIL) 10 MG tablet Take 10 mg by mouth daily.    metoprolol tartrate (LOPRESSOR) 25 MG tablet Take 0.5 tablets (12.5 mg total) by mouth 2 (two) times daily. Qty: 60 tablet, Refills: 3    nitroGLYCERIN (NITROSTAT) 0.4 MG SL tablet Place 0.4 mg under the tongue every 5 (five) minutes as needed for chest pain.    rosuvastatin (CRESTOR) 20 MG tablet Take 1 tablet (20 mg total) by mouth daily. Qty: 30 tablet, Refills: 5         Aspirin prescribed at discharge?  Yes High Intensity Statin Prescribed? (Lipitor 40-80mg  or Crestor 20-40mg ): Yes Beta Blocker Prescribed? Yes For EF <40%, was ACEI/ARB Prescribed? Yes ADP Receptor Inhibitor Prescribed? (i.e. Plavix etc.-Includes Medically Managed Patients): Yes For EF <40%, Aldosterone Inhibitor Prescribed? No: EF ok Was EF assessed during THIS hospitalization? Yes Was Cardiac Rehab II ordered? (Included Medically managed Patients): Yes   Outstanding Labs/Studies   N/a  Duration of Discharge Encounter   Greater than 30 minutes including physician time.  Signed, Laverda PageLindsay Roberts NP-C 03/09/2017, 10:47 AM

## 2017-03-08 NOTE — H&P (View-Only) (Signed)
Cardiology Office Note   Date:  02/28/2017   ID:  Harry LovelyChristopher Direnzo, DOB 02/23/1976, MRN [id number]  PCP:  Zenaida DeedMills, Pearce Michael, DO  Cardiologist:  Nanetta BattyJonathan Berry MD  Chief Complaint  Patient presents with  . Follow-up    Eval LAD  . Shortness of Breath  . Coronary Artery Disease      History of Present Illness: Harry Cervantes is a 41 y.o. male who presents for is seen at the request of Dr. Allyson SabalBerry for consideration of CTO PCI of the LAD. He was seen initially by Dr. Allyson SabalBerry in February this year with a history of exertional angina and palpitations. Holter showed PVCs. Myoview study was high risk with significant anterior and apical ischemia. This led to a cardiac cath that showed occlusion of the mid LAD. PCI was attempted but unsuccessful due the wire going subintimal. Medical therapy has been adjusted but the patient has continued to complain of exertional angina and dyspnea despite optimal medical therapy. He feels that this has been a significant negative impact on his lifestyle since he cannot lift weights, run, or even play with his son without getting pain.  He does have a history of prior tobacco use, HTN, and family history of CAD.   Past Medical History:  Diagnosis Date  . CAD (coronary artery disease)    a. 09/2016: abn stress test, cath revealed 99% mLAD, 90% mid-dist LAD, normal LV function (55-65%). Unsuccessful attempt at mid LAD intervention because of severity of disease and inability to cross the lesion successfully. Plan is for Rx therapy  . Hypertension     Past Surgical History:  Procedure Laterality Date  . LEFT HEART CATH AND CORONARY ANGIOGRAPHY N/A 10/10/2016   Procedure: Left Heart Cath and Coronary Angiography;  Surgeon: Runell GessJonathan J Berry, MD;  [facility];  Service: Cardiovascular;  Laterality: N/A;     Current Outpatient Prescriptions  Medication Sig Dispense Refill  . amLODipine (NORVASC) 5 MG tablet Take 1 tablet (5 mg total)  by mouth daily. 5 tablet 3  . aspirin EC 81 MG tablet Take 81 mg by mouth daily.    . Cholecalciferol (VITAMIN D3) 2000 units TABS Take 2,000 Units by mouth daily.    . clopidogrel (PLAVIX) 75 MG tablet Take 75 mg by mouth daily.  1  . isosorbide mononitrate (IMDUR) 30 MG 24 hr tablet Take 0.5 tablets (15 mg total) by mouth daily. 45 tablet 3  . lisinopril (PRINIVIL,ZESTRIL) 10 MG tablet Take 10 mg by mouth daily.    . metoprolol tartrate (LOPRESSOR) 25 MG tablet Take 0.5 tablets (12.5 mg total) by mouth 2 (two) times daily. 60 tablet 3  . nitroGLYCERIN (NITROSTAT) 0.4 MG SL tablet Place 0.4 mg under the tongue every 5 (five) minutes as needed for chest pain.    . rosuvastatin (CRESTOR) 20 MG tablet Take 1 tablet (20 mg total) by mouth daily. 30 tablet 5   No current facility-administered medications for this visit.     Allergies:   Patient has no known allergies.    Social History:  The patient  reports that he quit smoking about 12 months ago. His smokeless tobacco use includes Snuff. He reports that he does not drink alcohol.   Family History:  The patient's family history includes COPD in his father; Congestive Heart Failure in his father; Heart disease in his father; Hypertension in his father and mother.    ROS:  Please see the history of present illness.  Otherwise, review of systems are positive for none.   All other systems are reviewed and negative.    PHYSICAL EXAM: VS:  BP 122/80   Pulse 78   Ht 6' (1.829 m)   Wt 244 lb (110.7 kg)   BMI 33.09 kg/m  , BMI Body mass index is 33.09 kg/m. GEN: Well nourished, well developed, in no acute distress  HEENT: normal  Neck: no JVD, carotid bruits, or masses Cardiac: RRR; no murmurs, rubs, or gallops,no edema. Normal S1-2. All pulses 2+ and equal. No femoral bruits.  Respiratory:  clear to auscultation bilaterally, normal work of breathing GI: soft, nontender, nondistended, + BS MS: no deformity or atrophy  Skin: warm and dry,  no rash Neuro:  Strength and sensation are intact Psych: euthymic mood, full affect   EKG:  EKG is not ordered today. The ekg ordered today demonstrates N/A   Recent Labs: 10/05/2016: Magnesium 1.9; TSH 8.91 10/11/2016: Hemoglobin 12.5; Platelets 211 12/01/2016: ALT 19; BUN 10; Creat 0.97; Potassium 4.4; Sodium 141    Lipid Panel    Component Value Date/Time   CHOL 119 12/01/2016 1107   TRIG 50 12/01/2016 1107   HDL 38 (L) 12/01/2016 1107   CHOLHDL 3.1 12/01/2016 1107   VLDL 10 12/01/2016 1107   LDLCALC 71 12/01/2016 1107      Wt Readings from Last 3 Encounters:  02/28/17 244 lb (110.7 kg)  02/22/17 243 lb (110.2 kg)  01/13/17 242 lb (109.8 kg)      Other studies Reviewed: Additional studies/ records that were reviewed today include:  Myoview 10/06/16: Study Highlights     The left ventricular ejection fraction is moderately decreased (30-44%).  Nuclear stress EF: 43%.  Horizontal ST segment depression ST segment depression of 1 mm was noted during stress in the V6, V5, V4, aVF, III and II leads, beginning at 10 minutes of stress, and returning to baseline after 1-5 minutes of recovery.  Defect 1: There is a defect present in the basal anteroseptal, mid anterior, mid anteroseptal, apical anterior, apical septal and apex location.  Findings consistent with ischemia.  This is a high risk study.   High risk study consistent with extensive ischemia due to proximal LAD artery stenosis. Mild to moderate reduction in LV systolic function, likely due to ischemic/stunned (but viable) myocardium.    Holter monitor 10/06/16: Study Highlights   1. SR with occassional PVCs   Echo 10/10/16 Study Conclusions  - Left ventricle: The cavity size was normal. Systolic function was   normal. The estimated ejection fraction was in the range of 55%   to 60%. Wall motion was normal; there were no regional wall   motion abnormalities. The transmitral flow pattern was normal.   Left  ventricular diastolic function parameters were normal. - Aortic valve: Trileaflet; mildly thickened, mildly calcified   leaflets. - Mitral valve: There was trivial regurgitation. - Pulmonary arteries: Systolic pressure could not be accurately   estimated.  Cardiac cath 10/10/16: Procedures   Left Heart Cath and Coronary Angiography  Conclusion     Mid LAD lesion, 99 %stenosed.  Mid LAD to Dist LAD lesion, 90 %stenosed.  The left ventricular systolic function is normal.  LV end diastolic pressure is normal.  The left ventricular ejection fraction is 55-65% by visual estimate.   Harry Cervantes is a 41 y.o. male    [id number] LOCATION:  FACILITY: [facility]  PHYSICIAN: Nanetta BattyJonathan Berry, M.D. 12/30/1975   DATE OF PROCEDURE:  10/10/2016  DATE OF DISCHARGE:  CARDIAC CATHETERIZATION / PCI    History obtained from chart review. Mr. Forbush is a delightful 41 year old demented appearing married Caucasian male father of 5 children which accompany by his wife Delice Bison today. He was [facility] by [name] as a new patient on 10/05/16. He was seen in the emergency room several days before for palpitations and chest pain. This was factors for heart disease include treated hypertension, discontinued tobacco abuse and family history. Father did have a myocardial infarction and stent. 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 His Myoview stress test performed yesterday revealed positive GXT with ischemia in the LAD territory and an ejection fraction of 43%. He presents today for angiography and potential intervention.   PROCEDURE DESCRIPTION:   The patient was brought to the second floor [facility] cath lab in the postabsorptive state. He was premedicated with Valium 5 mg by mouth, IV Versed and fentanyl. His right groinwas prepped and shaved in usual sterile fashion. Xylocaine 1% was used for local anesthesia. A 6 French sheath was inserted into the right common  femoral artery using standard Seldinger technique. A 5 French right and left Judkins diagnostic catheters lung with a 5 French pigtail catheter were used for selective coronary angiography and left ventriculography respectively. Isovue dye was used for the entirety of the case. Retrograde aortic, left ventricular and pullback pressures were recorded.  The patient received 600 mg of Plavix by mouth as well as Angiomax bolus followed by infusion. His ACT was demonstrated to be therapeutic. He had a tight mid LAD lesion after a small to medium-sized first diagonal branch with faint right-to-left collaterals. Using an XB LAD 3.5 cm guide catheter along with an 014 Pro water guidewire which was placed in the diagonal branch and a Fielder XT guidewire which was used to attempt to cross the mid LAD lesion. I did balloon the lesion with a 2 mm balloon just beyond the first diagonal branch. I was never able to reliably get back into the true lumen with a Fielder wire. I did ask Dr. Clifton James to assist briefly. We extended the [facility] wire and used a 1.5 mm over-the-wire balloon to get beyond the diagonal branch and inject through the balloon to assess position. There was obvious staining which was probably intramural. It was then decided to abandon the procedure since the vessel was relatively small and the patient remained hemodynamic stable throughout the case. A bedside echo revealed normal LV function without evidence of pericardial effusion. The Angiomax was discontinued.  IMPRESSION: Unsuccessful attempt at mid LAD intervention because of severity of disease and inability to cross the lesion successfully. The patient has remained hemodynamically and electrocardiographically stable throughout the procedure. Bedside echo revealed his LV to be normal without effusion. Plan will be for medical therapy. We'll get a complete 2-D echo for LV function and continue aspirin at this time. We will add low-dose beta blocker as  well. The patient left the lab in stable condition.  Nanetta Batty. MD, [facility] 10/10/2016 11:08 AM      Indications   Coronary artery disease due to lipid rich plaque [I25.10, I25.83 (ICD-10-CM)]  Abnormal nuclear stress test [R94.39 (ICD-10-CM)]  Procedural Details/Technique   Technical Details Estimated blood loss <50 mL.  During this procedure the patient was administered the following to achieve and maintain moderate conscious sedation: Versed 2 mg, Fentanyl 100 mcg, while the patient's heart rate, blood pressure, and oxygen saturation were continuously monitored. The period of conscious sedation was 94  minutes, of which I was present face-to-face 100% of this time.    Coronary Findings   Dominance: Right  Left Anterior Descending  Dist LAD filled by collaterals from Inf Sept.  Mid LAD lesion, 99% stenosed.  Mid LAD to Dist LAD lesion, 90% stenosed.  Wall Motion              Left Heart   Left Ventricle The left ventricular size is normal. The left ventricular systolic function is normal. LV end diastolic pressure is normal. The left ventricular ejection fraction is 55-65% by visual estimate. No regional wall motion abnormalities.    Coronary Diagrams   Diagnostic Diagram          ASSESSMENT AND PLAN:  1.  CAD with CTO of the mid LAD. Failed primary PCI in February. Continued anginal symptoms despite optimal medical therapy. I reviewed prior angiograms with patient and wife. We discussed options for treatment including increasing medical therapy, CTO PCI, or CABG. He is agreeable to proceed with CTO PCI. The procedure and risks were reviewed including but not limited to death, myocardial infarction, perforation, stroke, arrythmias, bleeding, transfusion, emergency surgery, dye allergy, or renal dysfunction. The patient voices understanding and is agreeable to proceed. Will arrange for next Wednesday July 11.   2. HTN currently controlled.  3.  Hypercholesterolemia. Good control on statin therapy  4. Tobacco use disorder. Quit one year ago. Still dips.   5. Family history of CAD.    Current medicines are reviewed at length with the patient today.  The patient does not have concerns regarding medicines.  The following changes have been made:  no change  Labs/ tests ordered today include:   Orders Placed This Encounter  Procedures  . Basic metabolic panel  . CBC w/Diff/Platelet  . PT and PTT      Signed, Peter Swaziland, MD  02/28/2017 5:11 PM    [facility] [address] [place], [place], [address] Phone [phone], Fax [phone]

## 2017-03-08 NOTE — Interval H&P Note (Signed)
History and Physical Interval Note:  03/08/2017 11:05 AM  Harry Cervantes  has presented today for surgery, with the diagnosis of cto  The various methods of treatment have been discussed with the patient and family. After consideration of risks, benefits and other options for treatment, the patient has consented to  Procedure(s): Coronary CTO Intervention (N/A) as a surgical intervention .  The patient's history has been reviewed, patient examined, no change in status, stable for surgery.  I have reviewed the patient's chart and labs.  Questions were answered to the patient's satisfaction.   Cath Lab Visit (complete for each Cath Lab visit)  Clinical Evaluation Leading to the Procedure:   ACS: No.  Non-ACS:    Anginal Classification: CCS III  Anti-ischemic medical therapy: Maximal Therapy (2 or more classes of medications)  Non-Invasive Test Results: High-risk stress test findings: cardiac mortality >3%/year  Prior CABG: No previous CABG       Theron AristaPeter [facility], LLCJordanMD,FACC 03/08/2017 11:06 AM

## 2017-03-08 NOTE — Progress Notes (Addendum)
    Called to bedside due to left sided femoral hematoma noted s/p CTO LAD this afternoon. On assessment bilateral sheath in place. Staff at the bedside to pull. Left removed and pressure held. Hematoma noted, but soft. STAT CBC ordered. Given 500cc bolus. Vitals sign stable. Will check CT abd once second sheath out. Dr. Herbie BaltimoreHarding at the bedside to assess patient as well.   Janice CoffinSigned, Lindsay Roberts, NP-C 03/08/2017, 4:47 PM Pager: [phone]

## 2017-03-08 NOTE — Care Management Note (Signed)
Case Management Note  Patient Details  Name: Markus DaftChristopher Ralph Athey MRN: [id number] Date of Birth: 07/03/1976  Subjective/Objective:    From home with wife, pta indep, s/p Successful CTO PCI  With DES.  Will be on plavix.                Action/Plan: NCM will follow for dc needs.   Expected Discharge Date:                  Expected Discharge Plan:  Home/Self Care  In-House Referral:  NA  Discharge planning Services  CM Consult  Post Acute Care Choice:  NA Choice offered to:  NA  DME Arranged:  N/A DME Agency:  NA  HH Arranged:  NA HH Agency:  NA  Status of Service:  Completed, signed off  If discussed at Long Length of Stay Meetings, dates discussed:    Additional Comments:  Leone Havenaylor, Dariella Gillihan Clinton, RN 03/08/2017, 2:14 PM

## 2017-03-08 NOTE — Progress Notes (Addendum)
To 6c to remove 8 fr sheath from RFA. RN advises of hematoma to lfa- ACT  169, large ecchymotic area noted to LFA, Level 2 noted, 3 fr sheath aspirated and removed. Manual pressure held- hematoma expressed. [facility] to view LFA and noted pt. C/o back pain and drop in pressure from 138/60 to 116- systolic. Normal saline bolus administered on the orders of Dr. Herbie BaltimoreHarding and Janice NorrieLyndsey,NP- Pt. Returned to baseline . Lyndsey called Dr. Herbie BaltimoreHarding, Dr. Herbie BaltimoreHarding arrived and assessed . CBC drawn by Murvin NatalNieri, RN  And additional orders received. Pt. Medicated by Nieri,RN. I maintained maual pressure x 35 min. Hemostasis was easily achieved . LFA site soft with a reduction in the hematoma to a level 1. Additional orders pending, Kasey RCIS removing 8 fr from RFA- held pressure for 40 min with hemostasis achieved. Bilateral sterile dressing applied.Bilateral dp+ palpable Pt. And wife advised if laugh, cough, sneeze or bear down hold pressure with return demonstration. Pt. Left with v/s/s, siderails up x2  Bed in lowest post ion.

## 2017-03-08 NOTE — Progress Notes (Signed)
Pt. C/o R Groin pain. Call to Dr. SwazilandJordan he assessed. Orders received for 50 mcg of fentanyl for pain of 7/10- Bed assignment received .

## 2017-03-08 NOTE — Procedures (Signed)
No note

## 2017-03-09 ENCOUNTER — Encounter (HOSPITAL_COMMUNITY): Payer: Self-pay | Admitting: Cardiology

## 2017-03-09 ENCOUNTER — Telehealth: Payer: Self-pay | Admitting: Cardiology

## 2017-03-09 ENCOUNTER — Telehealth: Payer: Self-pay | Admitting: Cardiovascular Disease

## 2017-03-09 DIAGNOSIS — Z955 Presence of coronary angioplasty implant and graft: Secondary | ICD-10-CM | POA: Diagnosis not present

## 2017-03-09 DIAGNOSIS — E784 Other hyperlipidemia: Secondary | ICD-10-CM

## 2017-03-09 DIAGNOSIS — I25119 Atherosclerotic heart disease of native coronary artery with unspecified angina pectoris: Secondary | ICD-10-CM

## 2017-03-09 DIAGNOSIS — I209 Angina pectoris, unspecified: Secondary | ICD-10-CM | POA: Diagnosis not present

## 2017-03-09 DIAGNOSIS — R9439 Abnormal result of other cardiovascular function study: Secondary | ICD-10-CM

## 2017-03-09 DIAGNOSIS — S301XXA Contusion of abdominal wall, initial encounter: Secondary | ICD-10-CM

## 2017-03-09 DIAGNOSIS — I10 Essential (primary) hypertension: Secondary | ICD-10-CM | POA: Diagnosis not present

## 2017-03-09 HISTORY — DX: Presence of coronary angioplasty implant and graft: Z95.5

## 2017-03-09 LAB — BASIC METABOLIC PANEL
ANION GAP: 7 (ref 5–15)
BUN: 12 mg/dL (ref 6–20)
CALCIUM: 8.1 mg/dL — AB (ref 8.9–10.3)
CHLORIDE: 105 mmol/L (ref 101–111)
CO2: 24 mmol/L (ref 22–32)
Creatinine, Ser: 1.04 mg/dL (ref 0.61–1.24)
GFR calc Af Amer: >60 mL/min (ref >60)
GFR calc non Af Amer: >60 mL/min (ref >60)
Glucose, Bld: 125 mg/dL — ABNORMAL HIGH (ref 65–99)
Potassium: 4.5 mmol/L (ref 3.5–5.1)
Sodium: 136 mmol/L (ref 135–145)

## 2017-03-09 LAB — CBC
HCT: 37.7 % — ABNORMAL LOW (ref 39.0–52.0)
HEMOGLOBIN: 12.2 g/dL — AB (ref 13.0–17.0)
MCH: 29.1 pg (ref 26.0–34.0)
MCHC: 32.4 g/dL (ref 30.0–36.0)
MCV: 90 fL (ref 78.0–100.0)
Platelets: 198 10*3/uL (ref 150–400)
RBC: 4.19 MIL/uL — AB (ref 4.22–5.81)
RDW: 13.8 % (ref 11.5–15.5)
WBC: 8 10*3/uL (ref 4.0–10.5)

## 2017-03-09 MED FILL — Lidocaine HCl Local Preservative Free (PF) Inj 1%: INTRAMUSCULAR | Qty: 30 | Status: AC

## 2017-03-09 NOTE — Telephone Encounter (Signed)
New message     Patient would like to speak to someone regarding his return to work date.

## 2017-03-09 NOTE — Telephone Encounter (Signed)
He should be out of work for one week.  Harry Cervantes SwazilandJordan MD, [facility] HospitalFACC

## 2017-03-09 NOTE — Telephone Encounter (Signed)
Patient calling, states that he was recently discharged from having a heart cath procedure today and on his discharge papers he has a f/u appt on 03-23-17. Patient would like to know if he should stay out of work until the 03-23-17.

## 2017-03-09 NOTE — Telephone Encounter (Signed)
Patient notified of MD recommendations. Advised him to call back if his place of employment needs this to be faxed on his behalf. He voiced understanding.

## 2017-03-09 NOTE — Telephone Encounter (Signed)
OK to return to work July 26.  Harry Shiffer SwazilandJordan MD, [facility] LLCFACC

## 2017-03-09 NOTE — Telephone Encounter (Signed)
Patient of Dr. Allyson SabalBerry - Dr. SwazilandJordan did cath on 7/12. Will defer to MD to advise on length of time out of work

## 2017-03-09 NOTE — Telephone Encounter (Signed)
Returned call to patient regarding return to work date:  Patient states the week he is set to go back to work (7/19), he is scheduled to be "on call" which is strenuous work. 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 He would feel most comfortable going back to work after his office visit on 7/26  Will route to MD for advice   Message from earlier today copied below: SwazilandJordan, Peter M, MD  to Harvel RicksSharpe, Hayley A, RN     03/09/17 11:53 AM  Note    He should be out of work for one week.  Peter SwazilandJordan MD, [facility] LLCFACC

## 2017-03-09 NOTE — Progress Notes (Signed)
CARDIAC REHAB PHASE I   PRE:  Rate/Rhythm: 69 SR    BP: sitting 127/78    SaO2:   MODE:  Ambulation: 500 ft   POST:  Rate/Rhythm: 81 SR    BP: sitting 131/83     SaO2:   Tolerated well, groins sore but stable. Ed completed/reviewed. Understands importance of Plavix/ASA. Pt not ready to quit tobacco (snuff). Discussed risks. Pt unable to do CRPII due to work schedule. Will refer to [place] CRPII. Gave diet and ex gl.  [id number]   Harriet MassonRandi Kristan Keelia Graybill CES, ACSM 03/09/2017 8:55 AM

## 2017-03-10 DIAGNOSIS — S301XXA Contusion of abdominal wall, initial encounter: Secondary | ICD-10-CM

## 2017-03-10 NOTE — Telephone Encounter (Signed)
Yes that is ok to extend to August 1.  Peter SwazilandJordan MD, [facility] CampusFACC

## 2017-03-10 NOTE — Telephone Encounter (Signed)
New Message     Pt called back wanting to follow up on his return to work date.

## 2017-03-10 NOTE — Telephone Encounter (Signed)
Returned the call back to the patient to inform him that per Dr. SwazilandJordan he may stay out of work through July 26th. The patient then asked if he could extend it through August 1st since the 26th is in the middle of a 9 day work week. Will route to the provider for his recommendation.

## 2017-03-10 NOTE — Telephone Encounter (Signed)
The patient has been called back and notified that he can stay out of work through August 1st, per Dr. SwazilandJordan. Patient verbalized his appreciation.

## 2017-03-14 LAB — POCT ACTIVATED CLOTTING TIME: ACTIVATED CLOTTING TIME: 169 s

## 2017-03-17 NOTE — Progress Notes (Deleted)
Cardiology Office Note   Date:  03/17/2017   ID:  Harry Cervantes, DOB [birth year], MRN [id number]  PCP:  Harry Deed, DO  Cardiologist:  Harry Batty MD  No chief complaint on file.     History of Present Illness: Harry Cervantes is a 41 y.o. male who presents for follow up s/p CTO PCI of the LAD. He was seen initially by Dr. Allyson Cervantes in February this year with a history of exertional angina and palpitations. Holter showed PVCs. Myoview study was high risk with significant anterior and apical ischemia. This led to a cardiac cath that showed occlusion of the mid LAD. PCI was attempted but unsuccessful due the wire going subintimal. Medical therapy has been adjusted but the patient has continued to complain of exertional angina and dyspnea despite optimal medical therapy.   He does have a history of prior tobacco use, HTN, and family history of CAD. He returned to the cath lab and underwent successful CTO PCI on 03/08/17.   Past Medical History:  Diagnosis Date  . Anemia   . Coronary artery disease involving native coronary artery of native heart with angina pectoris (HCC)    a. 09/2016: abn stress test, cath revealed 99% mLAD, 90% mid-dist LAD with some R-Lt collaterals, normal LV function (55-65%). Unsuccessful attempt at mid LAD intervention because of severity of disease and inability to cross the lesion successfully. Plan is for Rx therapy  . High cholesterol   . Hypertension   . Hypoglycemia   . Presence of drug coated stent in LAD coronary artery 03/09/2017   ~CTO PCI of LAD - Synergy DES 3.0 x 38 - post-dilated to 3.5 mm    Past Surgical History:  Procedure Laterality Date  . ANTERIOR CERVICAL DECOMP/DISCECTOMY FUSION  ~ 2015   "5-6"  . BACK SURGERY    . CARDIAC CATHETERIZATION    . CORONARY ANGIOGRAPHY N/A 03/08/2017   Procedure: Coronary Angiography;  Surgeon: [place], Harry Sheahan M, MD;  Location: [facility] INVASIVE CV LAB;  Service: Cardiovascular;   Laterality: N/A;  . CORONARY ANGIOPLASTY WITH STENT PLACEMENT    . CORONARY CTO INTERVENTION  03/08/2017   Hattie Perch 03/08/2017  . CORONARY CTO INTERVENTION N/A 03/08/2017   Procedure: Coronary CTO Intervention;  Surgeon: [place], Harry Greening M, MD;  Location: [facility] INVASIVE CV LAB;  Service: Cardiovascular;  Laterality: N/A;  . LEFT HEART CATH AND CORONARY ANGIOGRAPHY N/A 10/10/2016   Procedure: Left Heart Cath and Coronary Angiography;  Surgeon: Harry Gess, MD;  Location: [facility] INVASIVE CV LAB;  Service: Cardiovascular;  Laterality: N/A;  . VASECTOMY    . VASECTOMY REVERSAL       Current Outpatient Prescriptions  Medication Sig Dispense Refill  . amLODipine (NORVASC) 5 MG tablet Take 1 tablet (5 mg total) by mouth daily. 5 tablet 3  . aspirin EC 81 MG tablet Take 81 mg by mouth daily.    . Cholecalciferol (VITAMIN D3) 2000 units TABS Take 2,000 Units by mouth daily.    . clopidogrel (PLAVIX) 75 MG tablet Take 75 mg by mouth daily.  1  . isosorbide mononitrate (IMDUR) 30 MG 24 hr tablet Take 0.5 tablets (15 mg total) by mouth daily. 45 tablet 3  . lisinopril (PRINIVIL,ZESTRIL) 10 MG tablet Take 10 mg by mouth daily.    . metoprolol tartrate (LOPRESSOR) 25 MG tablet Take 0.5 tablets (12.5 mg total) by mouth 2 (two) times daily. 60 tablet 3  . nitroGLYCERIN (NITROSTAT) 0.4 MG SL tablet Place 0.4  mg under the tongue every 5 (five) minutes as needed for chest pain.    . rosuvastatin (CRESTOR) 20 MG tablet Take 1 tablet (20 mg total) by mouth daily. (Patient taking differently: Take 20 mg by mouth at bedtime. ) 30 tablet 5   No current facility-administered medications for this visit.     Allergies:   Patient has no known allergies.    Social History:  The patient  reports that he quit smoking about 13 months ago. His smoking use included Cigarettes. He has a 11.50 pack-year smoking history. His smokeless tobacco use includes Snuff. He reports that he does not drink alcohol or use drugs.   Family  History:  The patient's family history includes COPD in his father; Congestive Heart Failure in his father; Heart disease in his father; Hypertension in his father and mother.    ROS:  Please see the history of present illness.   Otherwise, review of systems are positive for none.   All other systems are reviewed and negative.    PHYSICAL EXAM: VS:  There were no vitals taken for this visit. , BMI There is no height or weight on file to calculate BMI. GEN: Well nourished, well developed, in no acute distress  HEENT: normal  Neck: no JVD, carotid bruits, or masses Cardiac: RRR; no murmurs, rubs, or gallops,no edema. Normal S1-2. All pulses 2+ and equal. No femoral bruits.  Respiratory:  clear to auscultation bilaterally, normal work of breathing GI: soft, nontender, nondistended, + BS MS: no deformity or atrophy  Skin: warm and dry, no rash Neuro:  Strength and sensation are intact Psych: euthymic mood, full affect   EKG:  EKG is not ordered today. The ekg ordered today demonstrates N/A   Recent Labs: 10/05/2016: Magnesium 1.9; TSH 8.91 12/01/2016: ALT 19 03/09/2017: BUN 12; Creatinine, Ser 1.04; Hemoglobin 12.2; Platelets 198; Potassium 4.5; Sodium 136    Lipid Panel    Component Value Date/Time   CHOL 119 12/01/2016 1107   TRIG 50 12/01/2016 1107   HDL 38 (L) 12/01/2016 1107   CHOLHDL 3.1 12/01/2016 1107   VLDL 10 12/01/2016 1107   LDLCALC 71 12/01/2016 1107      Wt Readings from Last 3 Encounters:  03/09/17 238 lb 1.6 oz (108 kg)  02/28/17 244 lb (110.7 kg)  02/22/17 243 lb (110.2 kg)      Other studies Reviewed: Additional studies/ records that were reviewed today include:  Myoview 10/06/16: Study Highlights     The left ventricular ejection fraction is moderately decreased (30-44%).  Nuclear stress EF: 43%.  Horizontal ST segment depression ST segment depression of 1 mm was noted during stress in the V6, V5, V4, aVF, III and II leads, beginning at 10 minutes  of stress, and returning to baseline after 1-5 minutes of recovery.  Defect 1: There is a defect present in the basal anteroseptal, mid anterior, mid anteroseptal, apical anterior, apical septal and apex location.  Findings consistent with ischemia.  This is a high risk study.   High risk study consistent with extensive ischemia due to proximal LAD artery stenosis. Mild to moderate reduction in LV systolic function, likely due to ischemic/stunned (but viable) myocardium.    Holter monitor 10/06/16: Study Highlights   1. SR with occassional PVCs   Echo 10/10/16 Study Conclusions  - Left ventricle: The cavity size was normal. Systolic function was   normal. The estimated ejection fraction was in the range of 55%   to 60%. Wall motion  was normal; there were no regional wall   motion abnormalities. The transmitral flow pattern was normal.   Left ventricular diastolic function parameters were normal. - Aortic valve: Trileaflet; mildly thickened, mildly calcified   leaflets. - Mitral valve: There was trivial regurgitation. - Pulmonary arteries: Systolic pressure could not be accurately   estimated.  Cardiac cath 10/10/16: Procedures   Left Heart Cath and Coronary Angiography  Conclusion     Mid LAD lesion, 99 %stenosed.  Mid LAD to Dist LAD lesion, 90 %stenosed.  The left ventricular systolic function is normal.  LV end diastolic pressure is normal.  The left ventricular ejection fraction is 55-65% by visual estimate.   Harry Cervantes is a 41 y.o. male    [id number] LOCATION:  FACILITY: [facility]  PHYSICIAN: Harry Cervantes, Cervantes.D. 02/02/1976   DATE OF PROCEDURE:  10/10/2016  DATE OF DISCHARGE:     CARDIAC CATHETERIZATION / PCI    History obtained from chart review. Mr. Salvucci is a delightful 41 year old demented appearing married Caucasian male father of 5 children which accompany by his wife Delice Bison today. He was seen by [name] as a new  patient on 10/05/16. He was seen in the emergency room several days before for palpitations and chest pain. This was factors for heart disease include treated hypertension, discontinued tobacco abuse and family history. Father did have a myocardial infarction and stent. 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 His Myoview stress test performed yesterday revealed positive GXT with ischemia in the LAD territory and an ejection fraction of 43%. He presents today for angiography and potential intervention.   PROCEDURE DESCRIPTION:   The patient was brought to the second floor [place] [facility] cath lab in the postabsorptive state. He was premedicated with Valium 5 mg by mouth, IV Versed and fentanyl. His right groinwas prepped and shaved in usual sterile fashion. Xylocaine 1% was used for local anesthesia. A 6 French sheath was inserted into the right common femoral artery using standard Seldinger technique. A 5 French right and left Judkins diagnostic catheters lung with a 5 French pigtail catheter were used for selective coronary angiography and left ventriculography respectively. Isovue dye was used for the entirety of the case. Retrograde aortic, left ventricular and pullback pressures were recorded.  The patient received 600 mg of Plavix by mouth as well as Angiomax bolus followed by infusion. His ACT was demonstrated to be therapeutic. He had a tight mid LAD lesion after a small to medium-sized first diagonal branch with faint right-to-left collaterals. Using an XB LAD 3.5 cm guide catheter along with an 014 Pro water guidewire which was placed in the diagonal branch and a Fielder XT guidewire which was used to attempt to cross the mid LAD lesion. I did balloon the lesion with a 2 mm balloon just beyond the first diagonal branch. I was never able to reliably get back into the true lumen with a Fielder wire. I did ask Dr. Clifton James to assist briefly. We extended the [facility] wire and used a 1.5 mm  over-the-wire balloon to get beyond the diagonal branch and inject through the balloon to assess position. There was obvious staining which was probably intramural. It was then decided to abandon the procedure since the vessel was relatively small and the patient remained hemodynamic stable throughout the case. A bedside echo revealed normal LV function without evidence of pericardial effusion. The Angiomax was discontinued.  IMPRESSION: Unsuccessful attempt at mid LAD intervention because of severity of disease and  inability to cross the lesion successfully. The patient has remained hemodynamically and electrocardiographically stable throughout the procedure. Bedside echo revealed his LV to be normal without effusion. Plan will be for medical therapy. We'll get a complete 2-D echo for LV function and continue aspirin at this time. We will add low-dose beta blocker as well. The patient left the lab in stable condition.  Harry Cervantes. MD, [facility] 10/10/2016 11:08 AM      Indications   Coronary artery disease due to lipid rich plaque [I25.10, I25.83 (ICD-10-CM)]  Abnormal nuclear stress test [R94.39 (ICD-10-CM)]  Procedural Details/Technique   Technical Details Estimated blood loss <50 mL.  During this procedure the patient was administered the following to achieve and maintain moderate conscious sedation: Versed 2 mg, Fentanyl 100 mcg, while the patient's heart rate, blood pressure, and oxygen saturation were continuously monitored. The period of conscious sedation was 94 minutes, of which I was present face-to-face 100% of this time.    Coronary Findings   Dominance: Right  Left Anterior Descending  Dist LAD filled by collaterals from Inf Sept.  Mid LAD lesion, 99% stenosed.  Mid LAD to Dist LAD lesion, 90% stenosed.  Wall Motion              Left Heart   Left Ventricle The left ventricular size is normal. The left ventricular systolic function is normal. LV end diastolic  pressure is normal. The left ventricular ejection fraction is 55-65% by visual estimate. No regional wall motion abnormalities.    Coronary Diagrams   Diagnostic Diagram        CTO PCI 03/08/17: Conclusion     Mid LAD lesion, 99 %stenosed.  A STENT SYNERGY DES [id number] drug eluting stent was successfully placed.  Post intervention, there is a 0% residual stenosis.   1. Successful CTO PCI of the mid LAD with DES  Plan: DAPT for one year. Anticipate DC in am.     ASSESSMENT AND PLAN:  1.  CAD with CTO of the mid LAD. S/p successful CTO PCI with DES.   2. HTN currently controlled.  3. Hypercholesterolemia. Good control on statin therapy  4. Tobacco use disorder. Quit one year ago. Still dips.   5. Family history of CAD.    Current medicines are reviewed at length with the patient today.  The patient does not have concerns regarding medicines.  The following changes have been made:  no change  Labs/ tests ordered today include:   No orders of the defined types were placed in this encounter.     Signed, Jashiya Bassett [place], MD  03/17/2017 8:52 AM    [facility] [address] [place], [place], [address] Phone [phone], Fax [phone]

## 2017-03-23 ENCOUNTER — Encounter: Payer: Self-pay | Admitting: Cardiology

## 2017-03-23 ENCOUNTER — Ambulatory Visit (INDEPENDENT_AMBULATORY_CARE_PROVIDER_SITE_OTHER): Payer: Managed Care, Other (non HMO) | Admitting: Cardiology

## 2017-03-23 ENCOUNTER — Telehealth: Payer: Self-pay

## 2017-03-23 ENCOUNTER — Ambulatory Visit: Payer: Managed Care, Other (non HMO) | Admitting: Cardiology

## 2017-03-23 VITALS — BP 128/86 | HR 88 | Ht 72.0 in | Wt 247.8 lb

## 2017-03-23 DIAGNOSIS — E7800 Pure hypercholesterolemia, unspecified: Secondary | ICD-10-CM

## 2017-03-23 DIAGNOSIS — I25118 Atherosclerotic heart disease of native coronary artery with other forms of angina pectoris: Secondary | ICD-10-CM

## 2017-03-23 DIAGNOSIS — I10 Essential (primary) hypertension: Secondary | ICD-10-CM

## 2017-03-23 NOTE — Progress Notes (Signed)
Cardiology Office Note   Date:  03/23/2017   ID:  Harry DaftChristopher Ralph Gallus, DOB 12/01/1975, MRN [id number]  PCP:  Zenaida DeedMills, Melik Michael, DO  Cardiologist:  Nanetta BattyJonathan Berry MD  Chief Complaint  Patient presents with  . Coronary Artery Disease      History of Present Illness: Harry Cervantes is a 41 y.o. male seen for follow up  CTO PCI of the LAD. He was seen initially by Dr. Allyson SabalBerry in February this year with a history of exertional angina and palpitations. Holter showed PVCs. Myoview study was high risk with significant anterior and apical ischemia. This led to a cardiac cath that showed occlusion of the mid LAD. PCI was attempted but unsuccessful due the wire going subintimal. Medical therapy has been adjusted but the patient has continued to complain of exertional angina and dyspnea despite optimal medical therapy.   He does have a history of prior tobacco use, HTN, and family history of CAD.  He underwent successful CTO PCI of the LAD on 03/08/17 with DES. Post procedure he has done well except for a small knot in his left groin. He notes his chest feels 100% better and he is able to run with his dog now.   Past Medical History:  Diagnosis Date  . Anemia   . Coronary artery disease involving native coronary artery of native heart with angina pectoris (HCC)    a. 09/2016: abn stress test, cath revealed 99% mLAD, 90% mid-dist LAD with some R-Lt collaterals, normal LV function (55-65%). Unsuccessful attempt at mid LAD intervention because of severity of disease and inability to cross the lesion successfully. Plan is for Rx therapy  . High cholesterol   . Hypertension   . Hypoglycemia   . Presence of drug coated stent in LAD coronary artery 03/09/2017   ~CTO PCI of LAD - Synergy DES 3.0 x 38 - post-dilated to 3.5 mm    Past Surgical History:  Procedure Laterality Date  . ANTERIOR CERVICAL DECOMP/DISCECTOMY FUSION  ~ 2015   "5-6"  . BACK SURGERY    . CARDIAC  CATHETERIZATION    . CORONARY ANGIOGRAPHY N/A 03/08/2017   Procedure: Coronary Angiography;  Surgeon: SwazilandJordan, Peter M, MD;  [facility] CV LAB;  Service: Cardiovascular;  Laterality: N/A;  . CORONARY ANGIOPLASTY WITH STENT PLACEMENT    . CORONARY CTO INTERVENTION  03/08/2017   Hattie Perch/notes 03/08/2017  . CORONARY CTO INTERVENTION N/A 03/08/2017   Procedure: Coronary CTO Intervention;  Surgeon: SwazilandJordan, Peter M, MD;  [facility];  Service: Cardiovascular;  Laterality: N/A;  . LEFT HEART CATH AND CORONARY ANGIOGRAPHY N/A 10/10/2016   Procedure: Left Heart Cath and Coronary Angiography;  Surgeon: Runell GessJonathan J Berry, MD;  [facility];  Service: Cardiovascular;  Laterality: N/A;  . VASECTOMY    . VASECTOMY REVERSAL       Current Outpatient Prescriptions  Medication Sig Dispense Refill  . amLODipine (NORVASC) 5 MG tablet Take 1 tablet (5 mg total) by mouth daily. 5 tablet 3  . aspirin EC 81 MG tablet Take 81 mg by mouth daily.    . Cholecalciferol (VITAMIN D3) 2000 units TABS Take 2,000 Units by mouth daily.    . clopidogrel (PLAVIX) 75 MG tablet Take 75 mg by mouth daily.  1  . lisinopril (PRINIVIL,ZESTRIL) 10 MG tablet Take 10 mg by mouth daily.    . metoprolol tartrate (LOPRESSOR) 25 MG tablet Take 0.5 tablets (12.5 mg total) by mouth 2 (  two) times daily. 60 tablet 3  . nitroGLYCERIN (NITROSTAT) 0.4 MG SL tablet Place 0.4 mg under the tongue every 5 (five) minutes as needed for chest pain.    . rosuvastatin (CRESTOR) 20 MG tablet Take 1 tablet (20 mg total) by mouth daily. (Patient taking differently: Take 20 mg by mouth at bedtime. ) 30 tablet 5   No current facility-administered medications for this visit.     Allergies:   Patient has no known allergies.    Social History:  The patient  reports that he quit smoking about 13 months ago. His smoking use included Cigarettes. He has a 11.50 pack-year smoking history. His smokeless tobacco use includes Snuff.  He reports that he does not drink alcohol or use drugs.   Family History:  The patient's family history includes COPD in his father; Congestive Heart Failure in his father; Heart disease in his father; Hypertension in his father and mother.    ROS:  Please see the history of present illness.   Otherwise, review of systems are positive for none.   All other systems are reviewed and negative.    PHYSICAL EXAM: VS:  BP 128/86   Pulse 88   Ht 6' (1.829 m)   Wt 247 lb 12.8 oz (112.4 kg)   BMI 33.61 kg/m  , BMI Body mass index is 33.61 kg/m. GEN: Well nourished, well developed, in no acute distress  HEENT: normal  Neck: no JVD, carotid bruits, or masses Cardiac: RRR; no murmurs, rubs, or gallops,no edema. Normal S1-2. All pulses 2+ and equal. No femoral bruits.  Respiratory:  clear to auscultation bilaterally, normal work of breathing GI: soft, nontender, nondistended, + BS, right groin without hematoma. Left groin with small firm knot of scar tissue. Nontender. No bruit. MS: no deformity or atrophy  Skin: warm and dry, no rash Neuro:  Strength and sensation are intact Psych: euthymic mood, full affect   EKG:  EKG is not ordered today. The ekg ordered today demonstrates N/A   Recent Labs: 10/05/2016: Magnesium 1.9; TSH 8.91 12/01/2016: ALT 19 03/09/2017: BUN 12; Creatinine, Ser 1.04; Hemoglobin 12.2; Platelets 198; Potassium 4.5; Sodium 136    Lipid Panel    Component Value Date/Time   CHOL 119 12/01/2016 1107   TRIG 50 12/01/2016 1107   HDL 38 (L) 12/01/2016 1107   CHOLHDL 3.1 12/01/2016 1107   VLDL 10 12/01/2016 1107   LDLCALC 71 12/01/2016 1107      Wt Readings from Last 3 Encounters:  03/23/17 247 lb 12.8 oz (112.4 kg)  03/09/17 238 lb 1.6 oz (108 kg)  02/28/17 244 lb (110.7 kg)      Other studies Reviewed:  CTO PCI 03/08/17:Conclusion     Mid LAD lesion, 99 %stenosed.  A STENT SYNERGY DES [id number] drug eluting stent was successfully placed.  Post intervention,  there is a 0% residual stenosis.   1. Successful CTO PCI of the mid LAD with DES  Plan: DAPT for one year. Anticipate DC in am.     ASSESSMENT AND PLAN:  1.  CAD with CTO of the mid LAD. S/p successful PCI with DES. Marked improvement in anginal symptoms. Will continue DAPT for one year. Other medications will continue long term. Patient is cleared to return to work July 30 without restrictions. Will arrange follow up with Dr. Allyson SabalBerry in October.   2. HTN currently controlled.  3. Hypercholesterolemia. Good control on statin therapy  4. Tobacco use disorder. Quit one year ago. Still dips.  5. Family history of CAD.    Current medicines are reviewed at length with the patient today.  The patient does not have concerns regarding medicines.  The following changes have been made:  no change  Labs/ tests ordered today include:   No orders of the defined types were placed in this encounter.     Signed, Peter Swazil[place], MD  03/23/2017 5:48 PM    [facility] [address] [place], [place], [address] Phone [phone], Fax [phone]

## 2017-03-23 NOTE — Patient Instructions (Addendum)
Continue your current therapy  Follow up with Dr. Allyson SabalBerry in October

## 2017-03-23 NOTE — Telephone Encounter (Signed)
Returned to work letter faxed to [employer] at fax # [phone].

## 2017-03-28 ENCOUNTER — Telehealth: Payer: Self-pay | Admitting: Cardiology

## 2017-03-28 NOTE — Telephone Encounter (Signed)
Received [employer] Forms from [employer] for Dr Swazil[place]Jordan to review, complete and sign.  Received via Fax.  No AUTH/Pmt for [employer] to process.  Sent to [employer] to send letter to obtain AUTH/Pmt.  Sent via Courier on 03/28/17. lp

## 2017-04-03 ENCOUNTER — Other Ambulatory Visit: Payer: Self-pay | Admitting: *Deleted

## 2017-04-03 MED ORDER — CLOPIDOGREL BISULFATE 75 MG PO TABS: 75.0000 mg | ORAL_TABLET | Freq: Every day | ORAL | 3 refills | Status: DC

## 2017-04-17 ENCOUNTER — Other Ambulatory Visit: Payer: Self-pay | Admitting: Cardiology

## 2017-05-26 ENCOUNTER — Other Ambulatory Visit: Payer: Self-pay

## 2017-05-26 ENCOUNTER — Other Ambulatory Visit: Payer: Self-pay | Admitting: *Deleted

## 2017-05-26 ENCOUNTER — Ambulatory Visit: Payer: Managed Care, Other (non HMO) | Admitting: Cardiovascular Disease

## 2017-05-26 MED ORDER — METOPROLOL TARTRATE 25 MG PO TABS: 12.5000 mg | ORAL_TABLET | Freq: Two times a day (BID) | ORAL | 3 refills | Status: AC

## 2017-05-26 MED ORDER — ROSUVASTATIN CALCIUM 20 MG PO TABS
ORAL_TABLET | ORAL | 2 refills | Status: DC
Start: 2017-05-26 — End: 2018-02-05

## 2017-05-26 MED ORDER — CLOPIDOGREL BISULFATE 75 MG PO TABS: 75.0000 mg | ORAL_TABLET | Freq: Every day | ORAL | 3 refills | Status: DC

## 2017-06-21 ENCOUNTER — Ambulatory Visit: Payer: Managed Care, Other (non HMO) | Admitting: Cardiovascular Disease

## 2017-06-21 ENCOUNTER — Ambulatory Visit: Payer: Managed Care, Other (non HMO) | Admitting: Cardiology

## 2017-07-07 ENCOUNTER — Ambulatory Visit (INDEPENDENT_AMBULATORY_CARE_PROVIDER_SITE_OTHER): Payer: Managed Care, Other (non HMO) | Admitting: Cardiovascular Disease

## 2017-07-07 ENCOUNTER — Encounter: Payer: Self-pay | Admitting: Cardiovascular Disease

## 2017-07-07 DIAGNOSIS — E7800 Pure hypercholesterolemia, unspecified: Secondary | ICD-10-CM

## 2017-07-07 DIAGNOSIS — I25119 Atherosclerotic heart disease of native coronary artery with unspecified angina pectoris: Secondary | ICD-10-CM | POA: Diagnosis not present

## 2017-07-07 DIAGNOSIS — I10 Essential (primary) hypertension: Secondary | ICD-10-CM

## 2017-07-07 NOTE — Assessment & Plan Note (Signed)
History of essential hypertension blood pressure measures 132/82. He is on amlodipine, lisinopril and metoprolol. Continue current meds at current dosing.

## 2017-07-07 NOTE — Patient Instructions (Signed)

## 2017-07-07 NOTE — Assessment & Plan Note (Signed)
History of hyperlipidemia on statin therapy with lipid profile performed 12/01/16 revealing total cholesterol 119, LDL 71 and HDL of 38.

## 2017-07-07 NOTE — Progress Notes (Signed)
07/07/2017 Harry Cervantes Lundstrom   06/24/1976  [id number]  Primary Physician Zenaida DeedMills, Amato Michael, DO Primary Cardiologist: Runell GessJonathan J Lamonda Noxon MD Milagros LollFACP, FACC, [place], [place]  HPI:  Harry Cervantes Fiddler is a 41 y.o.married Caucasian male father of 5 children who I last saw in the office  02/22/17.He [facility]. He was seen in the emergency room several days before for palpitations and chest pain. This was factors for heart disease include treated hypertension, discontinued tobacco abuse and family history. Father did have a myocardial infarction and stent. 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 His Myoview stress test performed 10/06/16 revealed positive GXT with ischemia in the LAD territory and an ejection fraction of 43%. 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 I wire became subintimal and was never able to reenter the true lumen. An echocardiogram performed on the same day revealed normal LV function without focal wall motion abnormalities and without pericardial effusion. He was seen post discharge by Corine ShelterLuke Kilroy several days later, 10/18/16, and was doing well. because of ongoing stable but exertional chest pain I referred him to Dr. SwazilandJordan performed LAD CTO PCI and drug-eluting stenting using a 3 mm x 38 mm long Synergy drug-eluting stent. Since that time he's no longer had angina. He has lost approximately 25 pounds as a result of diabetic exercise as well.     Current Meds  Medication Sig  . amLODipine (NORVASC) 5 MG tablet Take 1 tablet (5 mg total) by mouth daily.  Marland Kitchen. aspirin EC 81 MG tablet Take 81 mg by mouth daily.  . Cholecalciferol (VITAMIN D3) 2000 units TABS Take 2,000 Units by mouth daily.  . clopidogrel (PLAVIX) 75 MG tablet Take 1 tablet (75 mg total) by mouth daily.  Marland Kitchen. FLUoxetine (PROZAC) 40 MG capsule  Take 40 mg daily by mouth.  Marland Kitchen. lisinopril (PRINIVIL,ZESTRIL) 10 MG tablet Take 10 mg by mouth daily.  . metoprolol tartrate (LOPRESSOR) 25 MG tablet Take 0.5 tablets (12.5 mg total) by mouth 2 (two) times daily.  . nitroGLYCERIN (NITROSTAT) 0.4 MG SL tablet Place 0.4 mg under the tongue every 5 (five) minutes as needed for chest pain.  . rosuvastatin (CRESTOR) 20 MG tablet TAKE 1 TABLET BY MOUTH DAILY     No Known Allergies  Social History   Socioeconomic History  . Marital status: Married    Spouse name: Not on file  . Number of children: Not on file  . Years of education: Not on file  . Highest education level: Not on file  Social Needs  . Financial resource strain: Not on file  . Food insecurity - worry: Not on file  . Food insecurity - inability: Not on file  . Transportation needs - medical: Not on file  . Transportation needs - non-medical: Not on file  Occupational History  . Not on file  Tobacco Use  . Smoking status: Former Smoker    Packs/day: 0.50    Years: 23.00    Pack years: 11.50    Types: Cigarettes    Last attempt to quit: 02/03/2016    Years since quitting: 1.4  . Smokeless tobacco: Current User    Types: Snuff  Substance and Sexual Activity  . Alcohol use: No  . Drug use: No  . Sexual activity: Yes  Other Topics Concern  .  Not on file  Social History Narrative  . Not on file     Review of Systems: General: negative for chills, fever, night sweats or weight changes.  Cardiovascular: negative for chest pain, dyspnea on exertion, edema, orthopnea, palpitations, paroxysmal nocturnal dyspnea or shortness of breath Dermatological: negative for rash Respiratory: negative for cough or wheezing Urologic: negative for hematuria Abdominal: negative for nausea, vomiting, diarrhea, bright red blood per rectum, melena, or hematemesis Neurologic: negative for visual changes, syncope, or dizziness All other systems reviewed and are otherwise negative except as  noted above.    Blood pressure 132/82, pulse 60, height 6' (1.829 m), weight 225 lb (102.1 kg).  General appearance: alert and no distress Neck: no adenopathy, no carotid bruit, no JVD, supple, symmetrical, trachea midline and thyroid not enlarged, symmetric, no tenderness/mass/nodules Lungs: clear to auscultation bilaterally Heart: regular rate and rhythm, S1, S2 normal, no murmur, click, rub or gallop Extremities: edema No edema Pulses: 2+ and symmetric Skin: Skin color, texture, turgor normal. No rashes or lesions Neurologic: Alert and oriented X 3, normal strength and tone. Normal symmetric reflexes. Normal coordination and gait  EKG not performed today  ASSESSMENT AND PLAN:   Coronary artery disease involving native coronary artery of native heart with angina pectoris (HCC) History of CAD status post cardiac catheterization by myself 10/10/16 after a positive GXT and subsequent Myoview that showed ischemia in the LAD territory. I cathed him through the femoral approach and attempted to cross and LAD CTO unsuccessfully. Because of continued effort angina he underwent CTO intervention by Dr. SwazilandJordan successfully 03/08/17 with a Synergy  3 mm x 38 mm long drug-eluting stent. He remains on dual antibiotic therapy. Since that time he said no recurrent angina.  Hypertension History of essential hypertension blood pressure measures 132/82. He is on amlodipine, lisinopril and metoprolol. Continue current meds at current dosing.  Hyperlipidemia History of hyperlipidemia on statin therapy with lipid profile performed 12/01/16 revealing total cholesterol 119, LDL 71 and HDL of 38.      Runell GessJonathan J. Kanisha Duba MD FACP,FACC,FAHA, [facility] 07/07/2017 4:50 PM

## 2017-07-07 NOTE — Assessment & Plan Note (Addendum)
History of CAD status post cardiac catheterization by myself 10/10/16 after a positive GXT and subsequent Myoview that showed ischemia in the LAD territory. I cathed him through the femoral approach and attempted to cross and LAD CTO unsuccessfully. Because of continued effort angina he underwent CTO intervention by Dr. SwazilandJordan successfully 03/08/17 with a Synergy  3 mm x 38 mm long drug-eluting stent. He remains on dual antibiotic therapy. Since that time he said no recurrent angina.

## 2017-12-21 ENCOUNTER — Other Ambulatory Visit: Payer: Self-pay

## 2017-12-21 ENCOUNTER — Encounter (HOSPITAL_BASED_OUTPATIENT_CLINIC_OR_DEPARTMENT_OTHER): Payer: Self-pay | Admitting: Emergency Medicine

## 2017-12-21 ENCOUNTER — Emergency Department (HOSPITAL_BASED_OUTPATIENT_CLINIC_OR_DEPARTMENT_OTHER)
Admission: EM | Admit: 2017-12-21 | Discharge: 2017-12-21 | Disposition: A | Discharge status: Home / Self Care | Payer: Managed Care, Other (non HMO) | Attending: Emergency Medicine | Admitting: Emergency Medicine

## 2017-12-21 DIAGNOSIS — M5412 Radiculopathy, cervical region: Secondary | ICD-10-CM | POA: Diagnosis not present

## 2017-12-21 DIAGNOSIS — I25119 Atherosclerotic heart disease of native coronary artery with unspecified angina pectoris: Secondary | ICD-10-CM | POA: Insufficient documentation

## 2017-12-21 DIAGNOSIS — Z79899 Other long term (current) drug therapy: Secondary | ICD-10-CM | POA: Diagnosis not present

## 2017-12-21 DIAGNOSIS — Z955 Presence of coronary angioplasty implant and graft: Secondary | ICD-10-CM | POA: Insufficient documentation

## 2017-12-21 DIAGNOSIS — I10 Essential (primary) hypertension: Secondary | ICD-10-CM | POA: Diagnosis not present

## 2017-12-21 DIAGNOSIS — M542 Cervicalgia: Secondary | ICD-10-CM | POA: Diagnosis present

## 2017-12-21 DIAGNOSIS — Z87891 Personal history of nicotine dependence: Secondary | ICD-10-CM | POA: Insufficient documentation

## 2017-12-21 DIAGNOSIS — Z7982 Long term (current) use of aspirin: Secondary | ICD-10-CM | POA: Diagnosis not present

## 2017-12-21 HISTORY — DX: Spinal stenosis, cervical region: M48.02

## 2017-12-21 MED ORDER — OXYCODONE-ACETAMINOPHEN 5-325 MG PO TABS: 1.0000 | ORAL_TABLET | ORAL | 0 refills | Status: AC | PRN

## 2017-12-21 MED ORDER — HYDROMORPHONE HCL 1 MG/ML IJ SOLN
2.0000 mg | Freq: Once | INTRAMUSCULAR | Status: AC
  Administered 2017-12-21: 2 mg via INTRAMUSCULAR
  Filled 2017-12-21: qty 2

## 2017-12-21 NOTE — ED Triage Notes (Signed)
Pt report awaking 2 days ago with neck pain. Denies known injury, has hx of neck surgery.

## 2017-12-21 NOTE — ED Provider Notes (Signed)
[facility] Provider Note: [name], MD, FACEP  CSN: [id number] MRN: [id number] ARRIVAL: 12/21/17 at 0434 ROOM: MH01/MH01   CHIEF COMPLAINT  Neck Pain   HISTORY OF PRESENT ILLNESS  12/21/17 5:06 AM Harry Cervantes is a 42 y.o. male with a history of C5-6 cervical fusion.  He is here with a 2-day history of neck pain.  The pain is on the left side of his neck radiating to his left shoulder.  The pain does not radiate to his hand but his left upper extremity feels slightly weak to him.  He denies numbness or sensory changes.  He denies injury to his neck.  Pain is exacerbated by any movement of his neck.  He rates his pain as a 9 out of 10 presently and it is sharp in nature.  [place] [facility] [place] reveals the patient has received no opioid prescriptions in the past 2 years.   Past Medical History:  Diagnosis Date  . Anemia   . Coronary artery disease involving native coronary artery of native heart with angina pectoris (HCC)    a. 09/2016: abn stress test, cath revealed 99% mLAD, 90% mid-dist LAD with some R-Lt collaterals, normal LV function (55-65%). Unsuccessful attempt at mid LAD intervention because of severity of disease and inability to cross the lesion successfully. Plan is for Rx therapy  . High cholesterol   . Hypertension   . Hypoglycemia   . Presence of drug coated stent in LAD coronary artery 03/09/2017   ~CTO PCI of LAD - Synergy DES 3.0 x 38 - post-dilated to 3.5 mm  . Spinal stenosis, cervical region     Past Surgical History:  Procedure Laterality Date  . ANTERIOR CERVICAL DECOMP/DISCECTOMY FUSION  ~ 2015   "5-6"  . BACK SURGERY    . CARDIAC CATHETERIZATION    . CORONARY ANGIOGRAPHY N/A 03/08/2017   Procedure: Coronary Angiography;  Surgeon: SwazilandJordan, Peter M, MD;  [facility];  Service: Cardiovascular;  Laterality: N/A;  . CORONARY ANGIOPLASTY WITH STENT PLACEMENT    .  CORONARY CTO INTERVENTION  03/08/2017   Hattie Perch/notes 03/08/2017  . CORONARY CTO INTERVENTION N/A 03/08/2017   Procedure: Coronary CTO Intervention;  Surgeon: SwazilandJordan, Peter M, MD;  [facility];  Service: Cardiovascular;  Laterality: N/A;  . LEFT HEART CATH AND CORONARY ANGIOGRAPHY N/A 10/10/2016   Procedure: Left Heart Cath and Coronary Angiography;  Surgeon: Runell GessJonathan J Berry, MD;  [facility];  Service: Cardiovascular;  Laterality: N/A;  . VASECTOMY    . VASECTOMY REVERSAL      Family History  Problem Relation Age of Onset  . Hypertension Mother   . Hypertension Father   . COPD Father   . Heart disease Father   . Congestive Heart Failure Father     Social History   Tobacco Use  . Smoking status: Former Smoker    Packs/day: 0.50    Years: 23.00    Pack years: 11.50    Types: Cigarettes    Last attempt to quit: 02/03/2016    Years since quitting: 1.8  . Smokeless tobacco: Current User    Types: Snuff  Substance Use Topics  . Alcohol use: No  . Drug use: No    Prior to Admission medications   Medication Sig Start Date End Date Taking? Authorizing Provider  amLODipine (NORVASC) 5 MG tablet Take 1 tablet (5 mg total) by mouth daily. 10/11/16  Janetta Hora, PA-C  aspirin EC 81 MG tablet Take 81 mg by mouth daily.    [provider]  Cholecalciferol (VITAMIN D3) 2000 units TABS Take 2,000 Units by mouth daily.    [provider]  clopidogrel (PLAVIX) 75 MG tablet Take 1 tablet (75 mg total) by mouth daily. 05/26/17   Runell Gess, MD  FLUoxetine (PROZAC) 40 MG capsule Take 40 mg daily by mouth.    [provider]  lisinopril (PRINIVIL,ZESTRIL) 10 MG tablet Take 10 mg by mouth daily.    [provider]  metoprolol tartrate (LOPRESSOR) 25 MG tablet Take 0.5 tablets (12.5 mg total) by mouth 2 (two) times daily. 05/26/17   Swaziland, Peter M, MD  nitroGLYCERIN (NITROSTAT) 0.4 MG SL tablet Place 0.4 mg under the tongue  every 5 (five) minutes as needed for chest pain.    [provider]  rosuvastatin (CRESTOR) 20 MG tablet TAKE 1 TABLET BY MOUTH DAILY 05/26/17   Swaziland, Peter M, MD    Allergies Patient has no known allergies.   REVIEW OF SYSTEMS  Negative except as noted here or in the History of Present Illness.   PHYSICAL EXAMINATION  Initial Vital Signs Blood pressure (!) 137/97, pulse 64, temperature 97.8 F (36.6 C), temperature source Oral, resp. rate 16, height 6' (1.829 m), weight 99.8 kg (220 lb), SpO2 98 %.  Examination General: Well-developed, well-nourished male in no acute distress; appearance consistent with age of record HENT: normocephalic; atraumatic Eyes: pupils equal, round and reactive to light; extraocular muscles intact Neck: supple but neck held still due to pain on movement of neck Heart: regular rate and rhythm Lungs: clear to auscultation bilaterally Abdomen: soft; nondistended; nontender; bowel sounds present Extremities: No deformity; full range of motion; pulses normal Neurologic: Awake, alert and oriented; motor function intact in all extremities and symmetric except mild weakness of left upper extremity; sensation intact and symmetric in upper extremities; no facial droop Skin: Warm and dry Psychiatric: Normal mood and affect   RESULTS  Summary of this visit's results, reviewed by myself:   EKG Interpretation  Date/Time:    Ventricular Rate:    PR Interval:    QRS Duration:   QT Interval:    QTC Calculation:   R Axis:     Text Interpretation:        Laboratory Studies: No results found for this or any previous visit (from the past 24 hour(s)). Imaging Studies: No results found.  ED COURSE and MDM  Nursing notes and initial vitals signs, including pulse oximetry, reviewed.  Vitals:   12/21/17 0439 12/21/17 0440  BP: (!) 137/97   Pulse: 64   Resp: 16   Temp: 97.8 F (36.6 C)   TempSrc: Oral   SpO2: 98%   Weight:  99.8 kg (220 lb)    Height:  6' (1.829 m)   Patient's pain is consistent with cervical radiculopathy.  It is difficult to discern which dermatome is involved.  He intends to follow-up with neurosurgery.  He prefers [facility] but we will also provide him a [facility] referral.  PROCEDURES    ED DIAGNOSES     ICD-10-CM   1. Cervical radiculopathy M54.12        Ellsworth Waldschmidt, Jonny Ruiz, MD 12/21/17 [phone]

## 2017-12-27 ENCOUNTER — Telehealth: Payer: Self-pay | Admitting: Cardiovascular Disease

## 2018-01-01 ENCOUNTER — Telehealth: Payer: Self-pay | Admitting: Cardiovascular Disease

## 2018-01-01 NOTE — Telephone Encounter (Signed)
New message      [facility] Pre-operative Risk Assessment    Request for surgical clearance:  1. What type of surgery is being performed? Anterior cervical discectomy and fusion  2. When is this surgery scheduled? TBD  3. What type of clearance is required (medical clearance vs. Pharmacy clearance to hold med vs. Both)? BOTH  4. Are there any medications that need to be held prior to surgery and how long? PLAVIX, ASPIRIN  5. Practice name and name of physician performing surgery? Dr. Berneta Sages [place]  6. What is your office phone number [phone]   7.   What is your office fax number [phone]  8.   Anesthesia type (None, local, MAC, general) ? [place] 01/01/2018, 2:50 PM  _________________________________________________________________   (provider comments below)

## 2018-01-01 NOTE — Telephone Encounter (Signed)
   Primary Cardiologist: Nanetta Batty, MD (Last seen 07/07/2017)  Chart reviewed as part of pre-operative protocol coverage.  S/p successful CTO PCI of the mid LAD with DES on 03/08/2017.  Plan: DAPT for one year.   Dr. Allyson Sabal, should we need to wait until July before interruption of DAPT? If ok to hold now, needs phone call to go over cardiac symptoms vs office visit.   [place], [place] 01/01/2018, 4:43 PM

## 2018-01-03 ENCOUNTER — Encounter: Payer: Self-pay | Admitting: Cardiovascular Disease

## 2018-01-03 NOTE — Telephone Encounter (Signed)
Follow Up:    Pt calling to find out the status of his clearance.Pt says he needs this asap please.

## 2018-01-03 NOTE — Telephone Encounter (Signed)
New Message:      Harry Cervantes from Dr. Dolores Hoose office is calling with a different fax number to send over clearance  Fax [phone] [facility]

## 2018-01-04 ENCOUNTER — Encounter: Payer: Self-pay | Admitting: Cardiovascular Disease

## 2018-01-04 NOTE — Telephone Encounter (Signed)
Dr Allyson Sabal, we received request for clearance for anterior cervical discectomy and fusion and request to hold aspirin and plavix.  Pt is S/p successful CTO PCI of the mid LAD with DES on 03/08/2017.  Plan: DAPT for one year.   Does this procedure need to wait until after 03/08/18?  Please route response back to CV DIV PREOP

## 2018-01-05 NOTE — Telephone Encounter (Signed)
Spoke with patient and patients wife who are aware and agreeable to not being cleared unless the patient has an OV with Dr. Allyson Sabal or an APP in June/July. Pt is aware that we cannot give clearance for procedure of stopping of dual anti platelet therapy (ASA and Plavix) until it has been 12 months from hist stent procedure. They will contact surgeons office and call to schedule. I called Kisha at Dr. Jonathon Jordan to update

## 2018-01-05 NOTE — Telephone Encounter (Addendum)
   Primary Cardiologist: Nanetta Batty, MD  Chart reviewed as part of pre-operative protocol coverage.   Per Dr. Allyson Sabal, optimally, we should wait 12 months from patient's last PCI to interrupt antiplatelet therapy for surgical surgery. This occurred on 03/08/17 so the patient would not be allowed to stop aspirin or Plavix until after this date in 2019. Even then, they would need to review with cardiology team to formally stop.  [facility] pre-op callback staff, please call patient to inform them of recommendation above. Would recommend arranging a f/u appointment late June/early July 2019 to review.  I will forward denial of clearance via Epic fax functoin to surgical team to make them aware. Alternatives to recommended therapy should be considered as deemed by requesting provider. Please call with questions.  Laurann [place], PA-C 01/05/2018, 2:10 PM

## 2018-01-05 NOTE — Telephone Encounter (Signed)
Optimally, we should wait 12 months from her procedure to interrupt antiplatelet therapy for her cervical laminectomy.

## 2018-01-07 ENCOUNTER — Encounter: Payer: Self-pay | Admitting: Cardiovascular Disease

## 2018-01-08 ENCOUNTER — Encounter: Payer: Self-pay | Admitting: Cardiovascular Disease

## 2018-01-08 ENCOUNTER — Telehealth: Payer: Self-pay | Admitting: Cardiovascular Disease

## 2018-01-08 NOTE — Telephone Encounter (Signed)
Spoke with patient and he is on the verge of loosing his job if he does not get back to work. He was denied surgery secondary to stent placement 10 months ago and Plavix therapy  Patient is willing to take any risks with holding the medication if he can just have the surgery to go back to work. Scheduled appointment for patient to see Dr Allyson Sabal tomorrow to discuss.

## 2018-01-08 NOTE — Telephone Encounter (Signed)
New Message:      Pt is calling in reference to a cardiac clearance

## 2018-01-09 ENCOUNTER — Ambulatory Visit (INDEPENDENT_AMBULATORY_CARE_PROVIDER_SITE_OTHER): Payer: Managed Care, Other (non HMO) | Admitting: Cardiovascular Disease

## 2018-01-09 ENCOUNTER — Encounter: Payer: Self-pay | Admitting: Cardiovascular Disease

## 2018-01-09 ENCOUNTER — Ambulatory Visit: Payer: Managed Care, Other (non HMO) | Admitting: Cardiovascular Disease

## 2018-01-09 DIAGNOSIS — I10 Essential (primary) hypertension: Secondary | ICD-10-CM | POA: Diagnosis not present

## 2018-01-09 DIAGNOSIS — I25119 Atherosclerotic heart disease of native coronary artery with unspecified angina pectoris: Secondary | ICD-10-CM

## 2018-01-09 DIAGNOSIS — E7800 Pure hypercholesterolemia, unspecified: Secondary | ICD-10-CM | POA: Diagnosis not present

## 2018-01-09 NOTE — Assessment & Plan Note (Signed)
History of essential hypertension her blood pressure measured today 136/72.  He is on metoprolol and lisinopril.  Continue current meds at current dosing

## 2018-01-09 NOTE — Progress Notes (Signed)
01/09/2018 Harry Cervantes   Feb 15, 1976  [id number]  Primary Physician Patient, No Pcp Per Primary Cardiologist: Runell Gess MD Milagros Loll, [place], [place]  HPI:  Harry Cervantes is a 42 y.o.  married Caucasian male father of 5 children who I last saw in the office  07/07/2017.He was seen by [name] [facility] as a new patient on 10/05/16. He was seen in the emergency room several days before for palpitations and chest pain. This was factors for heart disease include treated hypertension, discontinued tobacco abuse and family history. Father did have a myocardial infarction and stent. 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 His Myoview stress test performed 10/06/16 revealed positive GXT with ischemia in the LAD territory and an ejection fraction of 43%. 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 I wire became subintimal and was never able to reenter the true lumen. An echocardiogram performed on the same day revealed normal LV function without focal wall motion abnormalities and without pericardial effusion. He was seen post discharge by Corine Shelter several days later, 10/18/16, and was doing well. because of ongoing stable but exertional chest pain I referred him to Dr. [place]il[place] performed LAD CTO PCI and drug-eluting stenting using a 3 mm x 38 mm long Synergy drug-eluting stent. Since that time he's no longer had angina. He has lost approximately 25 pounds as a result of diet and exercise as well although he has gained back since I last saw him.  He does have severe cervical disc disease with cervical radiculopathy requiring surgical intervention.     Current Meds  Medication Sig  . amLODipine (NORVASC) 5 MG tablet Take 1 tablet (5 mg total) by mouth daily.  Marland Kitchen aspirin EC 81 MG tablet Take 81 mg by mouth daily.  . Cholecalciferol (VITAMIN D3) 2000 units TABS Take  2,000 Units by mouth daily.  . clopidogrel (PLAVIX) 75 MG tablet Take 1 tablet (75 mg total) by mouth daily.  Marland Kitchen FLUoxetine (PROZAC) 40 MG capsule Take 40 mg daily by mouth.  Marland Kitchen lisinopril (PRINIVIL,ZESTRIL) 10 MG tablet Take 10 mg by mouth daily.  . metoprolol tartrate (LOPRESSOR) 25 MG tablet Take 0.5 tablets (12.5 mg total) by mouth 2 (two) times daily.  . nitroGLYCERIN (NITROSTAT) 0.4 MG SL tablet Place 0.4 mg under the tongue every 5 (five) minutes as needed for chest pain.  Marland Kitchen oxyCODONE-acetaminophen (PERCOCET) 5-325 MG tablet Take 1 tablet by mouth every 4 (four) hours as needed for severe pain.  . rosuvastatin (CRESTOR) 20 MG tablet TAKE 1 TABLET BY MOUTH DAILY     No Known Allergies  Social History   Socioeconomic History  . Marital status: Married    Spouse name: Not on file  . Number of children: Not on file  . Years of education: Not on file  . Highest education level: Not on file  Occupational History  . Not on file  Social Needs  . Financial resource strain: Not on file  . Food insecurity:    Worry: Not on file    Inability: Not on file  . Transportation needs:    Medical: Not on file    Non-medical: Not on file  Tobacco Use  . Smoking status: Former Smoker    Packs/day: 0.50    Years: 23.00    Pack years: 11.50    Types: Cigarettes  Last attempt to quit: 02/03/2016    Years since quitting: 1.9  . Smokeless tobacco: Current User    Types: Snuff  Substance and Sexual Activity  . Alcohol use: No  . Drug use: No  . Sexual activity: Yes  Lifestyle  . Physical activity:    Days per week: Not on file    Minutes per session: Not on file  . Stress: Not on file  Relationships  . Social connections:    Talks on phone: Not on file    Gets together: Not on file    Attends religious service: Not on file    Active member of club or organization: Not on file    Attends meetings of clubs or organizations: Not on file    Relationship status: Not on file  . Intimate  partner violence:    Fear of current or ex partner: Not on file    Emotionally abused: Not on file    Physically abused: Not on file    Forced sexual activity: Not on file  Other Topics Concern  . Not on file  Social History Narrative  . Not on file     Review of Systems: General: negative for chills, fever, night sweats or weight changes.  Cardiovascular: negative for chest pain, dyspnea on exertion, edema, orthopnea, palpitations, paroxysmal nocturnal dyspnea or shortness of breath Dermatological: negative for rash Respiratory: negative for cough or wheezing Urologic: negative for hematuria Abdominal: negative for nausea, vomiting, diarrhea, bright red blood per rectum, melena, or hematemesis Neurologic: negative for visual changes, syncope, or dizziness All other systems reviewed and are otherwise negative except as noted above.    Blood pressure 136/72, pulse 77, height 6' (1.829 m), weight 252 lb (114.3 kg).  General appearance: alert and no distress Neck: no adenopathy, no carotid bruit, no JVD, supple, symmetrical, trachea midline and thyroid not enlarged, symmetric, no tenderness/mass/nodules Lungs: clear to auscultation bilaterally Heart: regular rate and rhythm, S1, S2 normal, no murmur, click, rub or gallop Extremities: extremities normal, atraumatic, no cyanosis or edema Pulses: 2+ and symmetric Skin: Skin color, texture, turgor normal. No rashes or lesions Neurologic: Alert and oriented X 3, normal strength and tone. Normal symmetric reflexes. Normal coordination and gait  EKG sinus rhythm at 77 with septal Q waves.  I personally reviewed this EKG.  ASSESSMENT AND PLAN:   Coronary artery disease involving native coronary artery of native heart with angina pectoris (HCC) History of CAD status post mid LAD CTO PCI and drug-eluting stenting using a 3 .0 X 38 mm Synergy drug-eluting stent by Dr. Lolita Rieger 711/18.  He was clinically improved after that.  He remains on dual  antifungal therapy and needs cervical disc surgery.  I believe at this time is safe to interrupt his antiplatelet surgery and he is a low risk surgical candidate.  Hypertension History of essential hypertension her blood pressure measured today 136/72.  He is on metoprolol and lisinopril.  Continue current meds at current dosing  Hyperlipidemia History of hyperlipidemia on statin therapy.      Runell Gess MD FACP,FACC,FAHA, FSCAI 01/09/2018 1:32 PM

## 2018-01-09 NOTE — Assessment & Plan Note (Signed)
History of hyperlipidemia on statin therapy. 

## 2018-01-09 NOTE — Patient Instructions (Signed)

## 2018-01-09 NOTE — Assessment & Plan Note (Signed)
History of CAD status post mid LAD CTO PCI and drug-eluting stenting using a 3 .0 X 38 mm Synergy drug-eluting stent by Dr. Lolita Rieger 711/18.  He was clinically improved after that.  He remains on dual antifungal therapy and needs cervical disc surgery.  I believe at this time is safe to interrupt his antiplatelet surgery and he is a low risk surgical candidate.

## 2018-01-10 NOTE — Telephone Encounter (Signed)
Office note from 01-09-18 faxed to the number provided.

## 2018-01-12 ENCOUNTER — Telehealth: Payer: Self-pay | Admitting: Cardiovascular Disease

## 2018-01-12 NOTE — Telephone Encounter (Signed)
Spoke with Olegario Messier, EKG placed in medical records for faxing.

## 2018-01-12 NOTE — Telephone Encounter (Signed)
New Message    Olegario Messier with [facility] is requesting a copy of patients EKG from 01/09/2018 to be faxed. The fax number is [phone]. Please call.

## 2018-01-16 ENCOUNTER — Encounter: Payer: Self-pay | Admitting: Cardiovascular Disease

## 2018-02-05 ENCOUNTER — Other Ambulatory Visit: Payer: Self-pay | Admitting: Cardiology

## 2018-03-09 ENCOUNTER — Ambulatory Visit: Payer: Managed Care, Other (non HMO) | Admitting: Cardiovascular Disease

## 2018-05-08 ENCOUNTER — Other Ambulatory Visit: Payer: Self-pay | Admitting: Cardiovascular Disease

## 2018-10-16 IMAGING — CR DG CHEST 2V
2 series · 2 of 2 positions shown · non-contrast
Comparison: 06/30/2015

CLINICAL DATA: Central chest pain

EXAM:
CHEST  2 VIEW

[chest pa]
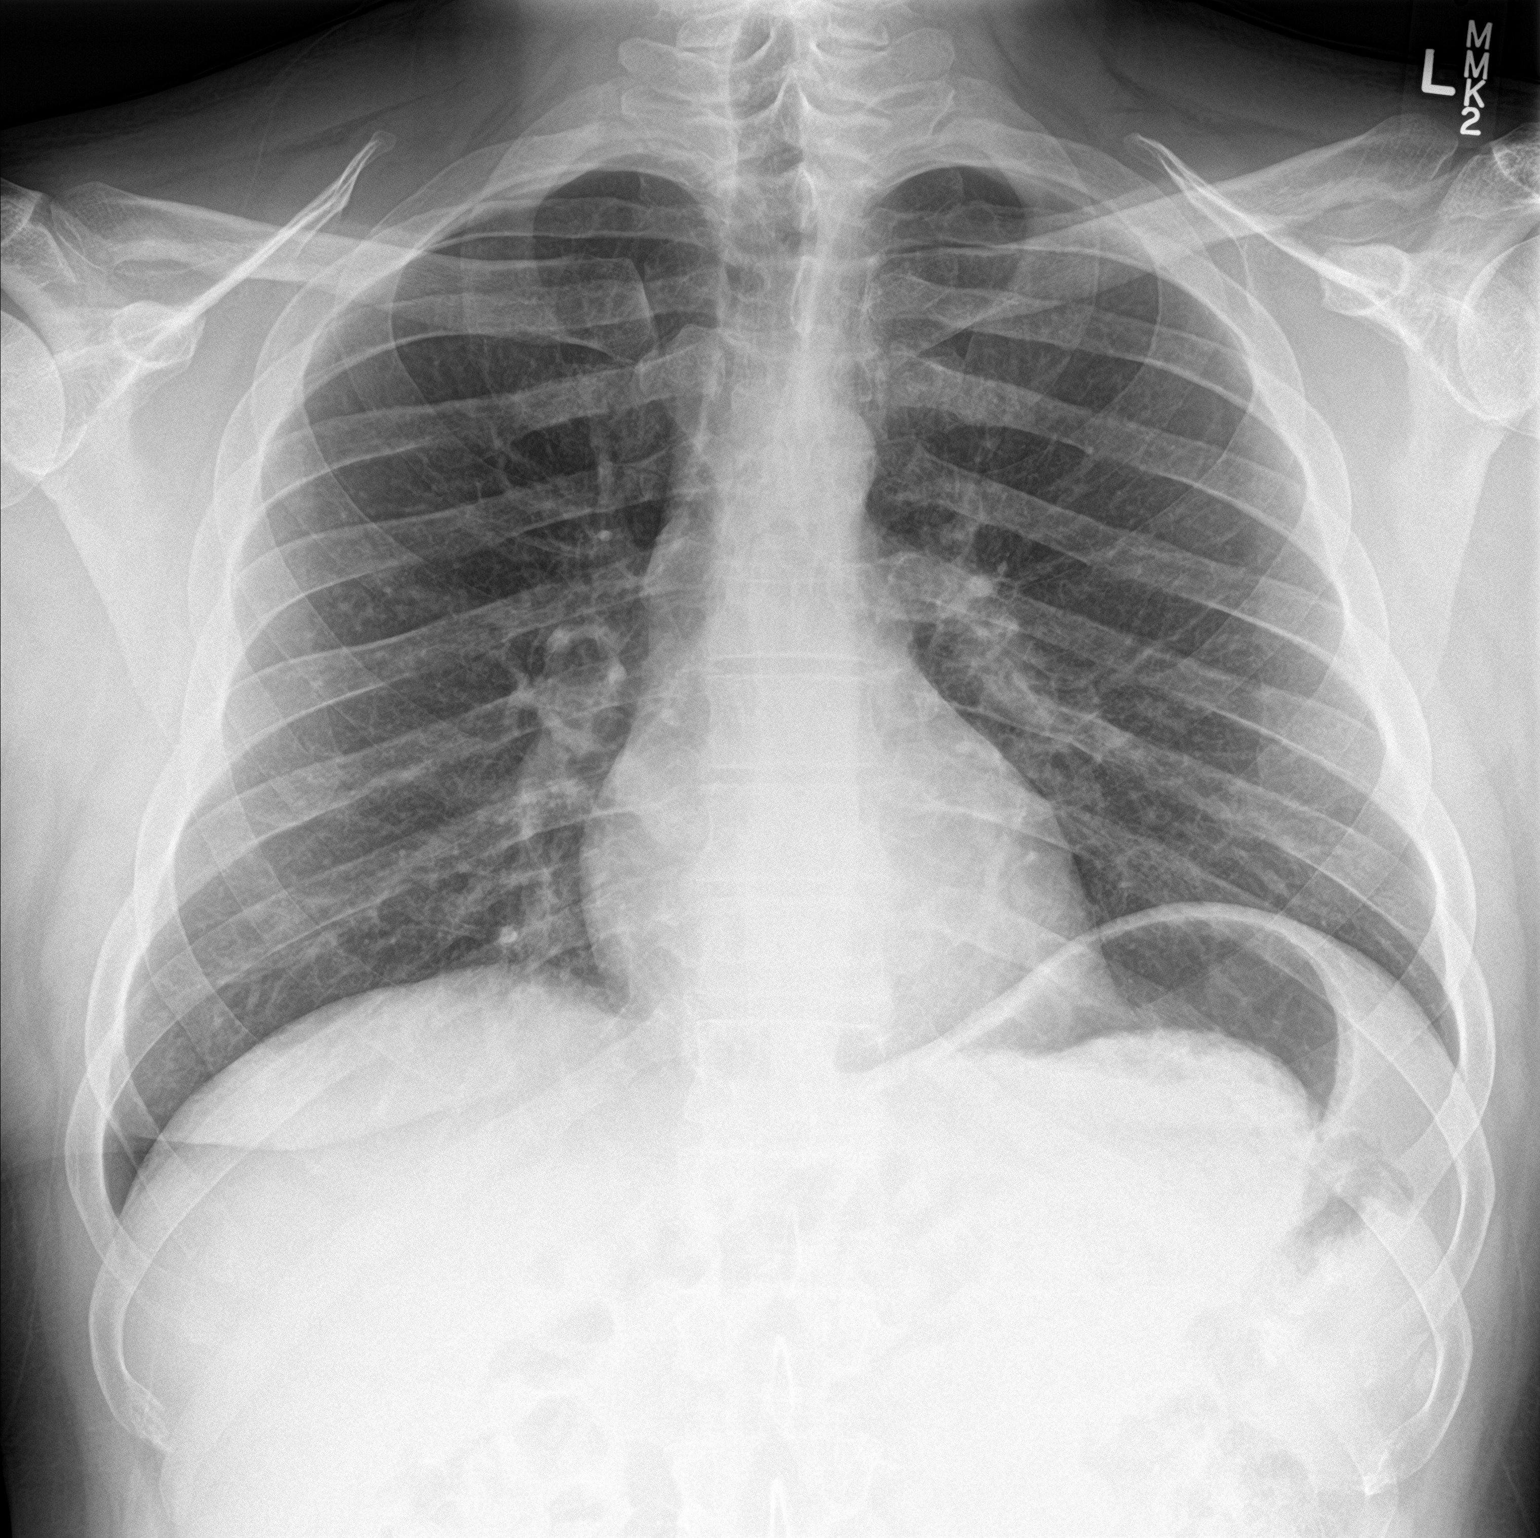

[chest lat]
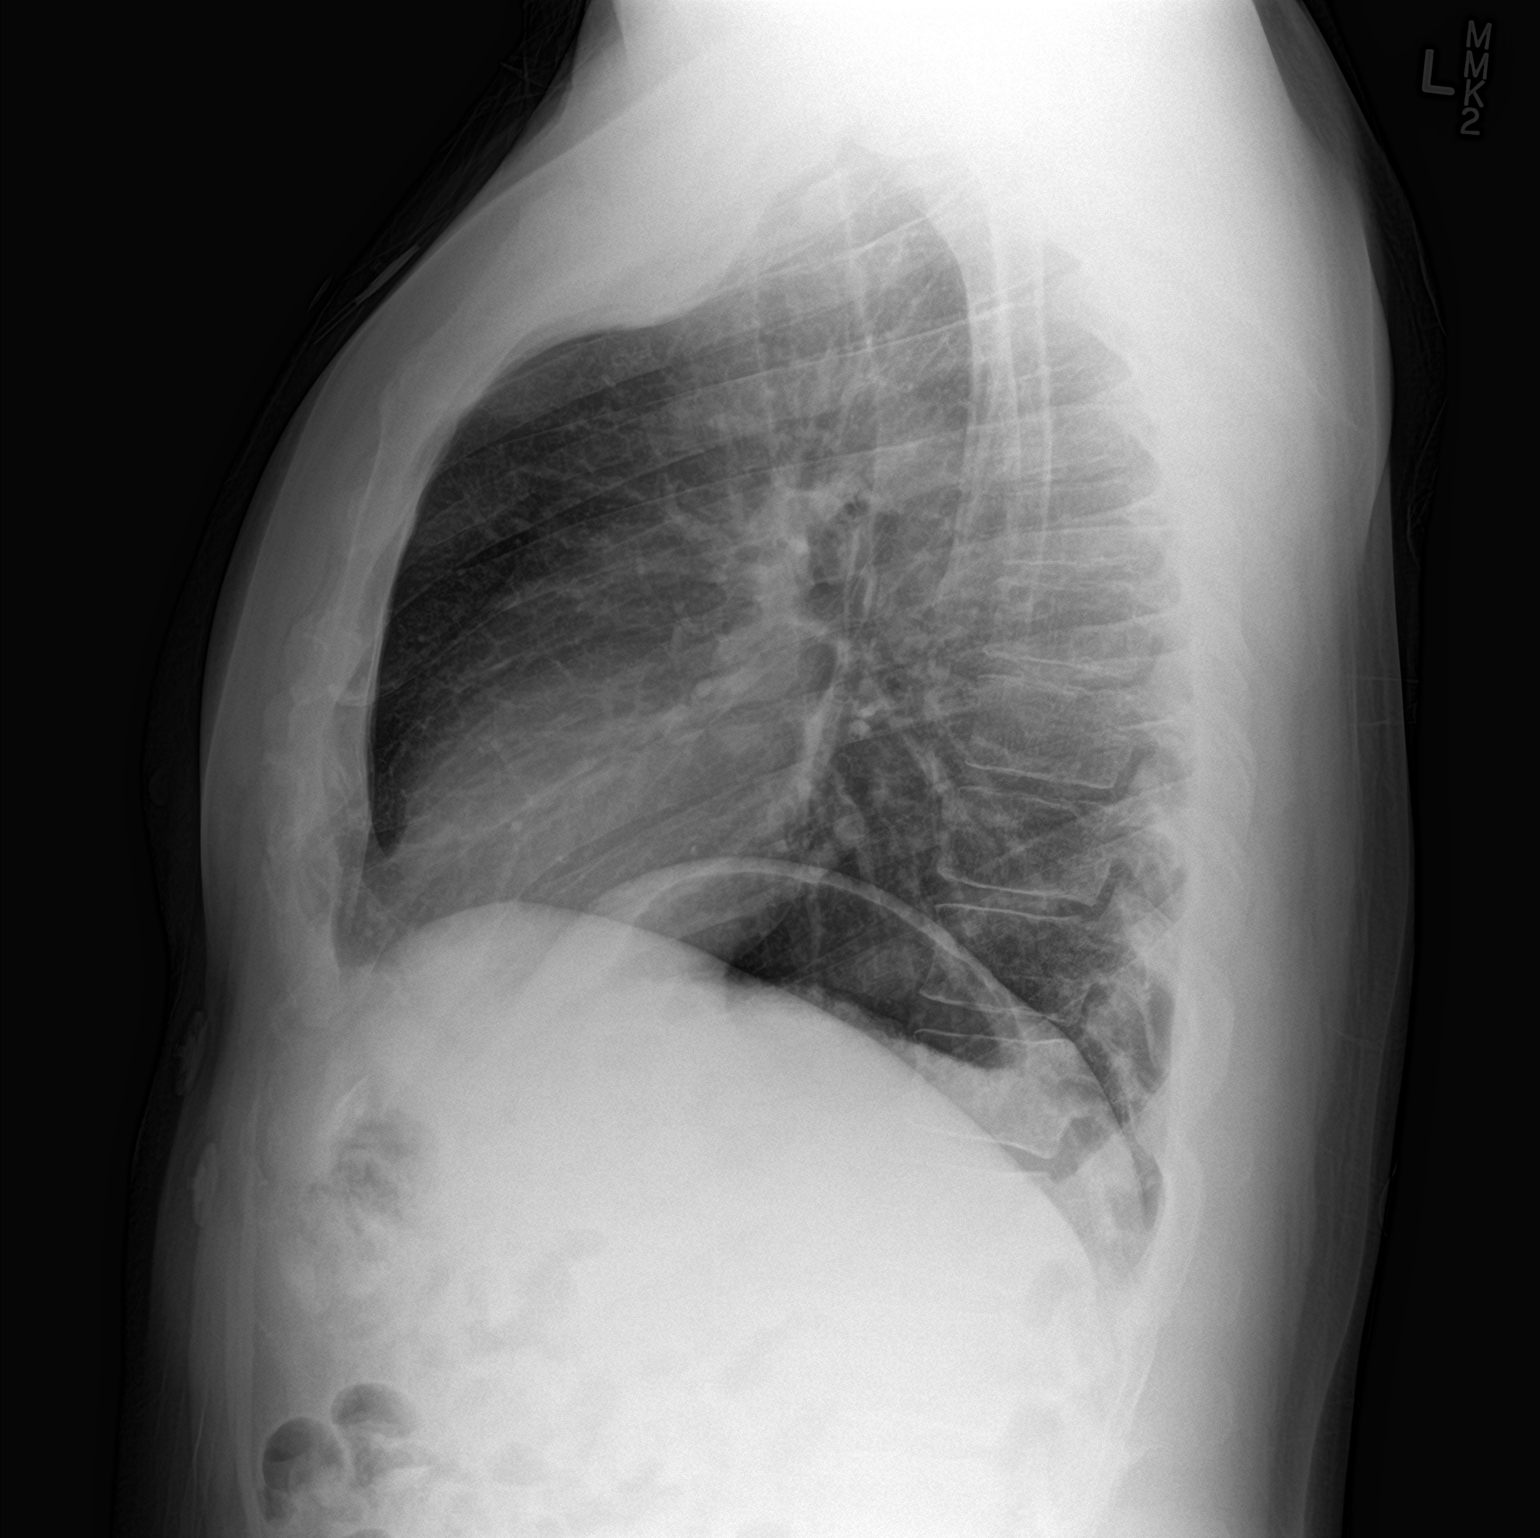

[2 of 2 positions shown; findings below may reference images not displayed]

FINDINGS: Slight elevation of the left diaphragm. No acute consolidation or
effusion. Normal heart size. No pneumothorax.
IMPRESSION: No active cardiopulmonary disease.

## 2018-12-26 ENCOUNTER — Telehealth: Payer: Self-pay | Admitting: *Deleted

## 2018-12-26 NOTE — Telephone Encounter (Signed)
Unable to reach by phone,no voicemail. 

## 2019-02-02 ENCOUNTER — Other Ambulatory Visit: Payer: Self-pay | Admitting: Cardiovascular Disease

## 2019-05-03 ENCOUNTER — Other Ambulatory Visit: Payer: Self-pay | Admitting: Cardiovascular Disease
